# Patient Record
Sex: Male | Born: 1955 | Race: White | Hispanic: No | Marital: Married | Smoking: Never smoker
Health system: Southern US, Community
[De-identification: ages and names within clinical notes are randomized; demographics above are authoritative.]

## PROBLEM LIST (undated history)

## (undated) HISTORY — PX: NASAL SEPTUM SURGERY: SHX37

---

## 2006-12-17 ENCOUNTER — Ambulatory Visit: Payer: Self-pay | Admitting: Cardiology

## 2006-12-17 LAB — CONVERTED CEMR LAB
ALT: 17 units/L (ref 0–53)
Albumin: 4 g/dL (ref 3.5–5.2)
Alkaline Phosphatase: 58 units/L (ref 39–117)
BUN: 19 mg/dL (ref 6–23)
Basophils Absolute: 0 10*3/uL (ref 0.0–0.1)
Basophils Relative: 0.4 % (ref 0.0–1.0)
CO2: 29 meq/L (ref 19–32)
Calcium: 9.2 mg/dL (ref 8.4–10.5)
Eosinophils Absolute: 0.1 10*3/uL (ref 0.0–0.6)
GFR calc Af Amer: 115 mL/min
GFR calc non Af Amer: 95 mL/min
HDL: 81.6 mg/dL (ref >39.0)
Lymphocytes Relative: 26.8 % (ref 12.0–46.0)
MCHC: 33.9 g/dL (ref 30.0–36.0)
Monocytes Relative: 9.4 % (ref 3.0–11.0)
Neutro Abs: 2.4 10*3/uL (ref 1.4–7.7)
Platelets: 209 10*3/uL (ref 150–400)
TSH: 1.05 microintl units/mL (ref 0.35–5.50)
Triglycerides: 31 mg/dL (ref 0–149)
VLDL: 6 mg/dL (ref 0–40)

## 2006-12-20 ENCOUNTER — Ambulatory Visit: Payer: Self-pay | Admitting: Cardiology

## 2007-01-29 ENCOUNTER — Ambulatory Visit: Payer: Self-pay | Admitting: Internal Medicine

## 2007-02-25 ENCOUNTER — Ambulatory Visit (HOSPITAL_BASED_OUTPATIENT_CLINIC_OR_DEPARTMENT_OTHER): Admission: RE | Admit: 2007-02-25 | Discharge: 2007-02-25 | Payer: Self-pay | Admitting: Orthopedic Surgery

## 2007-02-25 ENCOUNTER — Encounter (INDEPENDENT_AMBULATORY_CARE_PROVIDER_SITE_OTHER): Payer: Self-pay | Admitting: Orthopedic Surgery

## 2010-10-10 NOTE — Op Note (Signed)
NAMEKELVEN, FLATER            ACCOUNT NO.:  [id number]   MEDICAL RECORD NO.:  [id number]          PATIENT TYPE:  AMB   LOCATION:  DSC                          FACILITY:  [facility]   PHYSICIAN:  Katy Fitch. Sypher, M.D. DATE OF BIRTH:  07-Dec-1955   DATE OF PROCEDURE:  02/25/2007  DATE OF DISCHARGE:                               OPERATIVE REPORT   PREOPERATIVE DIAGNOSES:  1. Enlarging mass, dorsal radial aspect of left wrist -- overlying      extensor retinaculum.  2. Soft tissue mass, subfascial dorsal aspect of left forearm.  3. Soft tissue mass, subfascial dorsal aspect of left forearm.   POSTOPERATIVE DIAGNOSES:  1. Epidermal inclusion cyst.  2. Angiolipoma, dorsal left forearm.  3. Angiolipoma, dorsal left forearm.   OPERATIONS:  1. Excision of epidermal cyst, dorsal radial aspect of the left wrist.  2. Through separate incision, resection of dorsal forearm angiolipoma      measuring approximately 9 mm x 7 mm.  3. Excision through separate incision of angiolipoma from ulnar volar      aspect of left forearm -- through separate incision.   OPEARTING SURGEON:  Katy Fitch. Sypher, M.D.   ASSISTANT:  Molly Maduro Dasnoit PA-C.   ANESTHESIA:  General by LMA.   SUPERVISING ANESTHESIOLOGIST:  Guadalupe Maple, M.D.   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  This was identified by his attending cardiologist, Dr.  Daleen Squibb.  Dr. Daleen Squibb referred him for excisional biopsy.  Clinical  examination suggested either retinacular ganglion or some type of soft  tissue inclusion cyst.  Mr. Kirkwood could not recall any penetrating  injury.   We made arrangements for excisional biopsy at this time.   In the interval between his office visit and the current examination in  the holding area, he also noted 2 soft tissue masses in the subfascial  region of his extensor carpi ulnaris muscle  in the mid forearm, along  the border of the ulna.  These were bothersome to him.  He requested  that these be excised for diagnosis.   After informed consent, Mr. Schloemer was brought to the operating room  at this time.   PROCEDURE:  Tayron Hunnell was brought to operating room and placed  supine position on the operating room table.   Following an anesthesia consult by Dr. Noreene Larsson in the holding area.  An  IV regional block with a proximal brachial tourniquet was recommended  and accepted by Mr. Dugger.   He was brought [facility] the [facility]; placed in the supine  position on the operating room table, and under Dr. Morley Kos supervision  an IV regional block placed.  After 12 minutes he had excellent  anesthesia.  The arm was prepped with Betadine soapy solution and  sterilely draped.   The procedure commenced with a short transverse incision directly over  the wrist mass.  I Identified a small sinus tract with loupe  magnification, therefore a portion of normal skin was excised with the  sinus tract.  Circumferential dissection beneath the skin and fascia  identified a marble-sized mass consistent with an epidermal cyst.  This  was dissected off the extensor retinaculum and off of several  subcutaneous veins.  This was kept intact with the skin bridge and  passed off for pathologic evaluation.  The wound was then meticulously  irrigated with sterile saline to prevent recurrence of the epidermal  cyst; followed by repair of the skin with intradermal 3-0 Prolene and  Steri-Strips.   Attention then directed to the dorsal forearm.  Two incisions were  fashioned over the palpable masses.  At each situation the subcutaneous  tissues were released, followed by release of the fascia.  An  angiolipoma was noted on the more dorsal position, measuring  approximately 9 x 6 mm, and then the second more volar location  approximately 12 x 8 mm.   These were circumferentially  dissected and electrocauterized along the  feeding vessels.  These were placed in formalin for pathologic  evaluation.  The wounds were then irrigated, followed by repair of the  forearm wound with subcutaneous 4-0 Vicryl with knots buried deep, and  intradermal 3-0 Prolene and Steri-Strips.   There no apparent complications.   Mr. Pedley tolerated the surgery and anesthesia well.  He was  transferred to the recovery room with stable vital signs.      Katy Fitch Sypher, M.D.  Electronically Signed     RVS/MEDQ  D:  02/25/2007  T:  02/25/2007  Job:  [id number]   cc:   Darroll C. Wall, MD, [facility]

## 2010-10-10 NOTE — Assessment & Plan Note (Signed)
[facility]                            CARDIOLOGY OFFICE NOTE   NAME:Michael Casey                   MRN:          [id number]  DATE:12/20/2006                            DOB:          September 13, 1955    Michael Casey is a 55 year old married white male friend of mine,  who comes today for a physical.  Other than some generalized fatigue, he  is feeling well.   He last saw [place] in 2002.  He recently [place] property  [place].  He goes up there almost all weekends.  He enjoys  working on the farm, doing manual labor.  He has no symptoms of angina  or ischemic equivalence.  He is also an active walker and golfs a lot.   His risk factors are only age and sex.  He has remarkably good lipids.  He does not smoke.  He does not have hypertension.  He does not have a  premature family history.  He is not diabetic.   Prior to our visit, we obtained blood work.  He has a benign neutropenia  with a white count of 4000, which is not changed in the last few years.  His differential is normal.  His platelet count is normal.  His  hemoglobin is 14.6.  His CHEM-7 was normal, except for slightly  evaluated blood sugar of 101, which is really below 110, which is fine  with current guidelines.  His LFTs were normal.  His total cholesterol  was 185, triglycerides 31, HDL 81.6, LDL 97.  His TSH was normal.  PSA  was normal at 1.38.   PAST MEDICAL HISTORY:  He is currently on really no medications.  He has  no dye allergies.  He has no medical allergies.   He does drink 4 cups of caffeinated beverage a day.  He smoked but he  quit in 1979.   SURGICAL HISTORY:  He had a deviated septum repair in 1986.   FAMILY HISTORY:  Negative.   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   He is married and has 3 children.   REVIEW OF SYSTEMS:  Other than a small area on his left wrist, which he  has noticed  over the last several months, is negative.   He denies any melena, hematochezia, weight loss, appetite change.   PHYSICAL EXAMINATION:  His blood pressure is 122/78, pulse 58 and  regular, weight 204.  His EKG is normal.  HEENT:  Normocephalic, atraumatic.  PERRLA.  Extraocular muscles are  intact.  Sclerae clear.  Facial symmetry is normal.  Carotid upstrokes  are equal bilaterally without bruits.  There is no JVD.  Thyroid is not  enlarged.  His funduscopic exam is benign.  No hemorrhages.  No exudate.  No AV nicking or humping.  LUNGS:  Clear to auscultation and percussion.  HEART:  Nondisplaced PMI.  There is normal S1, S2 without murmur, rub,  or gallop.  ABDOMEN:  Soft with good bowel sounds.  No midline bruit.  No  hepatomegaly.  EXTREMITIES:  No cyanosis, clubbing, or edema.  Pulses are intact.  NEURO:  Intact.  SKIN:  Unremarkable.  He does have a small ganglionic cyst on the left  medial wrist.   ASSESSMENT AND PLAN:  Michael Casey is in excellent physical shape.  I think  he has a ganglionic cyst of his left wrist.  I have asked him to contact  Michael Casey, a friend of ours, to see if this needs to be removed.  It eventually probably will need to be removed.   He is due a screening colonoscopy, considering that he is over the age  of 15.  I am going to arrange a visit with Dr. Yancey Casey, whom he knows  as well.  I will plan on seeing him back in a year.   We talked about any indication for a stress test.  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  We  talked about symptoms to worry about and how to respond, and obviously  to notify us.   I will plan on seeing him back in a year.     Michael C. Daleen Squibb, MD, [facility]  Electronically Signed    TCW/MedQ  DD: 12/20/2006  DT: 12/21/2006  Job #: [id number]   cc:   Wilhemina Bonito. Marina Goodell, MD  Katy Fitch Sypher, M.D.

## 2011-03-08 LAB — POCT HEMOGLOBIN-HEMACUE
Hemoglobin: 13.4
Operator id: 112821

## 2012-10-30 ENCOUNTER — Other Ambulatory Visit: Payer: Self-pay | Admitting: Dermatology

## 2012-12-17 ENCOUNTER — Other Ambulatory Visit: Payer: Self-pay | Admitting: Dermatology

## 2013-02-13 ENCOUNTER — Ambulatory Visit (INDEPENDENT_AMBULATORY_CARE_PROVIDER_SITE_OTHER): Payer: PRIVATE HEALTH INSURANCE | Admitting: Internal Medicine

## 2013-02-13 DIAGNOSIS — Z23 Encounter for immunization: Secondary | ICD-10-CM

## 2013-02-13 DIAGNOSIS — Z789 Other specified health status: Secondary | ICD-10-CM

## 2013-02-13 MED ORDER — CIPROFLOXACIN HCL 500 MG PO TABS: 500.0000 mg | ORAL_TABLET | Freq: Two times a day (BID) | ORAL | Status: DC

## 2013-02-13 MED ORDER — ATOVAQUONE-PROGUANIL HCL 250-100 MG PO TABS: 1.0000 | ORAL_TABLET | Freq: Every day | ORAL | Status: DC

## 2013-02-14 NOTE — Progress Notes (Signed)
[facility] TRAVEL CLINIC NOTE  RFV: upcoming travel [place] [facility] [place] Subjective:    Patient ID: Michael Casey, male    DOB: 05-10-1956, 57 y.o.   MRN: [id number]  HPI Michael Casey is a 57 yo M in good state of health who will be traveling with his wife to [place] for a period of 2 wks. He has had some vaccines in the past inc hep A/B, and typhoid for previous international travel. He also donates consistently blood and platelets and asks what traveling to [place] will do to his donor status.   Imms: hep A, B, tdap, typhoid in 2009 All: NKMA     Review of Systems     Objective:   Physical Exam        Assessment & Plan:  Pre-travel vaccination = recommended to get yellow fever since their itinerary goes back to [place] after going to [place]. Also received typhoid inj. Recommended to get flu prior to leaving  Malaria prophylaxis = gave rx for malarone and instructions to use  Traveler's diarrhea= gave rx for cipro  Also recommended to get registered on [place] state dept and have travel insurance.

## 2017-03-19 DIAGNOSIS — Z23 Encounter for immunization: Secondary | ICD-10-CM | POA: Diagnosis not present

## 2017-04-09 ENCOUNTER — Encounter: Payer: Self-pay | Admitting: Sports Medicine

## 2017-04-09 ENCOUNTER — Ambulatory Visit (INDEPENDENT_AMBULATORY_CARE_PROVIDER_SITE_OTHER): Payer: 59 | Admitting: Sports Medicine

## 2017-04-09 DIAGNOSIS — M7711 Lateral epicondylitis, right elbow: Secondary | ICD-10-CM | POA: Diagnosis not present

## 2017-04-09 DIAGNOSIS — M7710 Lateral epicondylitis, unspecified elbow: Secondary | ICD-10-CM | POA: Insufficient documentation

## 2017-04-09 NOTE — Progress Notes (Signed)
   [facility] Phone: [phone]  Subjective:  Michael Casey is a 61 year old male presenting to clinic with right elbow pain for the last 2-3 months. The pain is located in his lateral elbow. He describes the pain as "able to get his attention". He normally lifts weights 2-3 times per week. He took 2-3 weeks off of lifting and then returned to lifting, where he did bicep curls, pushups, and lat pulldowns. He noticed a "twinge" in his lateral elbow while he was doing this. The next day, he did a lot of yard work. The elbow pain started while he was doing yard work. He also played golf a few days later and this really irritated his elbow too. The pain is worse with grabbing an object and lifting up. The pain is better with using a compression sleeve, icing, and Biofreeze. He has been resting his elbow over the last 2 weeks and has not been doing any weight training. The elbow has improved with rest. He denies any swelling of the elbow or decreased range of motion. He denies any radiating pain or numbness. He endorses some occasional mild tingling in the lower lateral aspect of his left hand and fifth finger.  ROS: See HPI for pertinent positives and negatives  Past medical history reviewed Medications reviewed Allergies reviewed  Objective: BP 116/82   Ht 5\' 11"  (1.803 m)   Wt 193 lb (87.5 kg)   BMI 26.92 kg/m  Gen: NAD, alert, cooperative with exam Right Elbow: No erythema, edema, or gross deformity. Full range of motion of the elbow. Tenderness to palpation where the extensor tendons attach to the lateral epicondyle. Tenderness to palpation where the extensor carpi radialis longus tendon attaches to the lateral supracondylar ridge of the humerus. Pain with resisted extension at the wrist.  Neuro: Distal right upper extremity is neurovascularly intact.   US Exam of Right Elbow: Traction spur present off the lateral epicondyle. Calcifications and signs of partial tearing present  within the extensor tendons near the region where it attaches to the lateral epicondyle.   Assessment/Plan: Right Lateral Epicondylitis: Patient with pain over the area where the extensor tendons attach to the lateral epicondyle after doing yard work. Ultrasound exam showed a traction spur off the lateral epicondyle with some chronic changes to the extensor tendons including calcifications and some signs of partial tearing.  - Patient given handout for home rehabilitation exercises - Continue compression sleeve, icing, NSAIDs prn - Rest with avoidance of exercises that cause pain for the next 3 weeks. Can start slowly re-introducing exercises after that. - Follow-up with Dr. Micheline Chapman in 6 weeks - If no improvement, could consider formal PT at that time   Hyman Bible, MD PGY-3  Patient seen and evaluated with the resident. I agree with the above plan of care. Patient's symptoms are already improving with 3 weeks of relative rest. I recommended an additional 3 weeks of relative rest before increasing his activity in the gym. Follow-up with me in 6 weeks for reevaluation. If symptoms persist we could consider merits of topical nitroglycerin or physical therapy. Patient is encouraged to call me with questions or concerns prior to his follow-up visit.

## 2017-04-09 NOTE — Assessment & Plan Note (Signed)
Patient with pain over the area where the extensor tendons attach to the lateral epicondyle after doing yard work. Ultrasound exam showed a traction spur off the lateral epicondyles with some chronic changes to the extensor tendons including calcifications and some signs of partial tearing.  - Patient given handout for home rehabilitation exercises - Continue compression sleeve, icing, NSAIDs prn - Rest with avoidance of exercises that cause pain for the next 3 weeks. Can start slowly re-introducing exercises after that. - Follow-up with Dr. Micheline Chapman in 6 weeks - If no improvement, could consider formal PT at that time

## 2017-04-10 ENCOUNTER — Encounter: Payer: Self-pay | Admitting: Sports Medicine

## 2017-04-17 DIAGNOSIS — Z125 Encounter for screening for malignant neoplasm of prostate: Secondary | ICD-10-CM | POA: Diagnosis not present

## 2017-04-17 DIAGNOSIS — Z0000 Encounter for general adult medical examination without abnormal findings: Secondary | ICD-10-CM | POA: Diagnosis not present

## 2017-04-25 DIAGNOSIS — K210 Gastro-esophageal reflux disease with esophagitis: Secondary | ICD-10-CM | POA: Diagnosis not present

## 2017-04-25 DIAGNOSIS — R002 Palpitations: Secondary | ICD-10-CM | POA: Diagnosis not present

## 2017-04-25 DIAGNOSIS — Z0000 Encounter for general adult medical examination without abnormal findings: Secondary | ICD-10-CM | POA: Diagnosis not present

## 2017-05-16 ENCOUNTER — Encounter: Payer: Self-pay | Admitting: Sports Medicine

## 2017-05-16 ENCOUNTER — Ambulatory Visit (INDEPENDENT_AMBULATORY_CARE_PROVIDER_SITE_OTHER): Payer: 59 | Admitting: Sports Medicine

## 2017-05-16 VITALS — BP 133/87 | Ht 71.0 in | Wt 193.0 lb

## 2017-05-16 DIAGNOSIS — M7711 Lateral epicondylitis, right elbow: Secondary | ICD-10-CM

## 2017-05-17 NOTE — Progress Notes (Signed)
   Subjective:    Patient ID: Michael Casey, male    DOB: 07/03/1955, 61 y.o.   MRN: [id number]  HPI   Patient comes in today for follow-up on right elbow lateral epicondylitis. He is doing much better. Minimal pain. He has been compliant with his home exercises. He has returned to weight lifting at the gym but is avoiding those exercises that cause pain. He is only needing an occasional Aleve. He is very happy with his progress to date.   Review of Systems    as above Objective:   Physical Exam  Well-developed, well-nourished. No acute distress. Awake alert and oriented 3. Vital signs reviewed  Right elbow: Full range of motion. No effusion. No soft tissue swelling. He is still tender to palpation over the lateral epicondyle and has slight pain with ECRB testing but not as severe as it was previously. No tenderness to palpation over the medial epicondyle. Good grip strength. Good pulses.      Assessment & Plan:   Improving right elbow pain secondary to lateral epicondylitis  Patient is doing well. I've encouraged him to continue with his home exercise program for an additional 3-6 weeks. He also needs to continue to modify his weight lifting to avoid those exercises that cause him pain. If symptoms once again worsen then we could consider topical nitroglycerin or formal physical therapy. Follow-up for ongoing or recalcitrant issues.

## 2018-02-03 ENCOUNTER — Emergency Department (HOSPITAL_COMMUNITY): Payer: 59

## 2018-02-03 ENCOUNTER — Encounter (HOSPITAL_COMMUNITY): Payer: Self-pay | Admitting: Emergency Medicine

## 2018-02-03 ENCOUNTER — Observation Stay (HOSPITAL_COMMUNITY)
Admission: EM | Admit: 2018-02-03 | Discharge: 2018-02-04 | Disposition: A | Discharge status: Home / Self Care | Payer: 59 | Attending: Surgery | Admitting: Surgery

## 2018-02-03 DIAGNOSIS — K3589 Other acute appendicitis without perforation or gangrene: Principal | ICD-10-CM | POA: Insufficient documentation

## 2018-02-03 DIAGNOSIS — K37 Unspecified appendicitis: Secondary | ICD-10-CM | POA: Diagnosis present

## 2018-02-03 DIAGNOSIS — K3580 Unspecified acute appendicitis: Secondary | ICD-10-CM | POA: Diagnosis not present

## 2018-02-03 LAB — I-STAT CHEM 8, ED
BUN: 21 mg/dL (ref 8–23)
CREATININE: 1 mg/dL (ref 0.61–1.24)
Calcium, Ion: 1.2 mmol/L (ref 1.15–1.40)
Chloride: 105 mmol/L (ref 98–111)
GLUCOSE: 134 mg/dL — AB (ref 70–99)
HCT: 44 % (ref 39.0–52.0)
Hemoglobin: 15 g/dL (ref 13.0–17.0)
Potassium: 3.7 mmol/L (ref 3.5–5.1)
Sodium: 139 mmol/L (ref 135–145)
TCO2: 24 mmol/L (ref 22–32)

## 2018-02-03 LAB — CBC
HEMATOCRIT: 44.9 % (ref 39.0–52.0)
Hemoglobin: 15.3 g/dL (ref 13.0–17.0)
MCH: 30.2 pg (ref 26.0–34.0)
MCHC: 34.1 g/dL (ref 30.0–36.0)
MCV: 88.7 fL (ref 78.0–100.0)
Platelets: 224 10*3/uL (ref 150–400)
RBC: 5.06 MIL/uL (ref 4.22–5.81)
RDW: 12.4 % (ref 11.5–15.5)
WBC: 9.5 10*3/uL (ref 4.0–10.5)

## 2018-02-03 LAB — URINALYSIS, ROUTINE W REFLEX MICROSCOPIC
BILIRUBIN URINE: NEGATIVE
GLUCOSE, UA: NEGATIVE mg/dL
Hgb urine dipstick: NEGATIVE
Ketones, ur: 20 mg/dL — AB
Leukocytes, UA: NEGATIVE
Nitrite: NEGATIVE
PH: 5 (ref 5.0–8.0)
Protein, ur: NEGATIVE mg/dL
Specific Gravity, Urine: >1.046 — ABNORMAL HIGH (ref 1.005–1.030)

## 2018-02-03 LAB — COMPREHENSIVE METABOLIC PANEL
ALBUMIN: 4.3 g/dL (ref 3.5–5.0)
ALT: 15 U/L (ref 0–44)
AST: 25 U/L (ref 15–41)
Alkaline Phosphatase: 71 U/L (ref 38–126)
Anion gap: 11 (ref 5–15)
BUN: 18 mg/dL (ref 8–23)
CHLORIDE: 103 mmol/L (ref 98–111)
CO2: 24 mmol/L (ref 22–32)
Calcium: 9.5 mg/dL (ref 8.9–10.3)
Creatinine, Ser: 1 mg/dL (ref 0.61–1.24)
GFR calc Af Amer: >60 mL/min (ref >60)
GFR calc non Af Amer: >60 mL/min (ref >60)
GLUCOSE: 139 mg/dL — AB (ref 70–99)
Potassium: 3.7 mmol/L (ref 3.5–5.1)
Sodium: 138 mmol/L (ref 135–145)
Total Bilirubin: 1.2 mg/dL (ref 0.3–1.2)
Total Protein: 7.2 g/dL (ref 6.5–8.1)

## 2018-02-03 LAB — LIPASE, BLOOD: Lipase: 32 U/L (ref 11–51)

## 2018-02-03 MED ORDER — METRONIDAZOLE IN NACL 5-0.79 MG/ML-% IV SOLN
500.0000 mg | Freq: Once | INTRAVENOUS | Status: AC
  Administered 2018-02-03: 500 mg via INTRAVENOUS
  Filled 2018-02-03: qty 100

## 2018-02-03 MED ORDER — METHOCARBAMOL 1000 MG/10ML IJ SOLN: 500.0000 mg | Freq: Three times a day (TID) | INTRAVENOUS | Status: DC | PRN

## 2018-02-03 MED ORDER — METRONIDAZOLE IN NACL 5-0.79 MG/ML-% IV SOLN
500.0000 mg | Freq: Three times a day (TID) | INTRAVENOUS | Status: DC
  Administered 2018-02-04 (×2): 500 mg via INTRAVENOUS
  Filled 2018-02-03 (×3): qty 100

## 2018-02-03 MED ORDER — ONDANSETRON HCL 4 MG/2ML IJ SOLN
4.0000 mg | Freq: Four times a day (QID) | INTRAMUSCULAR | Status: DC | PRN
  Administered 2018-02-04: 4 mg via INTRAVENOUS
  Filled 2018-02-03: qty 2

## 2018-02-03 MED ORDER — SODIUM CHLORIDE 0.9 % IV SOLN
INTRAVENOUS | Status: DC
  Administered 2018-02-04: via INTRAVENOUS

## 2018-02-03 MED ORDER — ACETAMINOPHEN 650 MG RE SUPP: 650.0000 mg | Freq: Four times a day (QID) | RECTAL | Status: DC | PRN

## 2018-02-03 MED ORDER — KETOROLAC TROMETHAMINE 30 MG/ML IJ SOLN
30.0000 mg | Freq: Once | INTRAMUSCULAR | Status: AC
  Administered 2018-02-03: 30 mg via INTRAVENOUS
  Filled 2018-02-03: qty 1

## 2018-02-03 MED ORDER — ONDANSETRON 4 MG PO TBDP
8.0000 mg | ORAL_TABLET | Freq: Once | ORAL | Status: AC
  Administered 2018-02-03: 8 mg via ORAL
  Filled 2018-02-03: qty 2

## 2018-02-03 MED ORDER — HYDROMORPHONE HCL 1 MG/ML IJ SOLN
1.0000 mg | INTRAMUSCULAR | Status: DC | PRN
  Administered 2018-02-03: 1 mg via INTRAVENOUS
  Filled 2018-02-03: qty 1

## 2018-02-03 MED ORDER — SODIUM CHLORIDE 0.9 % IV SOLN
2.0000 g | Freq: Once | INTRAVENOUS | Status: AC
  Administered 2018-02-03: 2 g via INTRAVENOUS
  Filled 2018-02-03: qty 20

## 2018-02-03 MED ORDER — HYDROMORPHONE HCL 1 MG/ML IJ SOLN
1.0000 mg | INTRAMUSCULAR | Status: DC | PRN
  Administered 2018-02-04 (×2): 1 mg via INTRAVENOUS
  Filled 2018-02-03 (×2): qty 1

## 2018-02-03 MED ORDER — ENOXAPARIN SODIUM 40 MG/0.4ML ~~LOC~~ SOLN: 40.0000 mg | SUBCUTANEOUS | Status: DC

## 2018-02-03 MED ORDER — IOPAMIDOL (ISOVUE-300) INJECTION 61%
INTRAVENOUS | Status: AC
  Filled 2018-02-03: qty 100

## 2018-02-03 MED ORDER — OXYCODONE-ACETAMINOPHEN 5-325 MG PO TABS
2.0000 | ORAL_TABLET | Freq: Once | ORAL | Status: AC | PRN
  Administered 2018-02-03: 2 via ORAL
  Filled 2018-02-03: qty 2

## 2018-02-03 MED ORDER — SODIUM CHLORIDE 0.9 % IV SOLN
2.0000 g | INTRAVENOUS | Status: DC
  Filled 2018-02-03: qty 20

## 2018-02-03 MED ORDER — SIMETHICONE 80 MG PO CHEW
40.0000 mg | CHEWABLE_TABLET | Freq: Four times a day (QID) | ORAL | Status: DC | PRN
  Filled 2018-02-03: qty 1

## 2018-02-03 MED ORDER — IOPAMIDOL (ISOVUE-300) INJECTION 61%
100.0000 mL | Freq: Once | INTRAVENOUS | Status: AC | PRN
  Administered 2018-02-03: 100 mL via INTRAVENOUS

## 2018-02-03 MED ORDER — ONDANSETRON 4 MG PO TBDP: 4.0000 mg | ORAL_TABLET | Freq: Four times a day (QID) | ORAL | Status: DC | PRN

## 2018-02-03 MED ORDER — ACETAMINOPHEN 325 MG PO TABS: 650.0000 mg | ORAL_TABLET | Freq: Four times a day (QID) | ORAL | Status: DC | PRN

## 2018-02-03 MED ORDER — ONDANSETRON HCL 4 MG/2ML IJ SOLN
4.0000 mg | Freq: Three times a day (TID) | INTRAMUSCULAR | Status: DC | PRN
  Administered 2018-02-03: 4 mg via INTRAVENOUS
  Filled 2018-02-03: qty 2

## 2018-02-03 NOTE — ED Notes (Signed)
Pt called out twice for pain medication, Dr Donne Hazel made aware and will come see the pt.

## 2018-02-03 NOTE — ED Triage Notes (Signed)
Pt presents with RLQ abd pain that began this morning at 4am; pt also having bloating, gas, and nausea; last BM yesterday; pt concerned for appendix

## 2018-02-03 NOTE — H&P (Signed)
Michael Casey is an 62 y.o. male.   Chief Complaint: abd pain HPI: 20 yom on his birthday today developed about 0430 abdominal pain worse throughout the day.  Some nausea, no emesis.   Had csc about 10 years ago.  Nothing making the pain better, just worse especially with movement. He is able to get up and go to restroom now.  No fevers.  He came to er and underwent ct scan that shows appendicitis   History reviewed. No pertinent past medical history.  Past Surgical History:  Procedure Laterality Date  . NASAL SEPTUM SURGERY      History reviewed. No pertinent family history. Social History:  reports that he has never smoked. He has never used smokeless tobacco. He reports that he has current or past drug history. His alcohol history is not on file.  Allergies: No Known Allergies  meds none  Results for orders placed or performed during the hospital encounter of 02/03/18 (from the past 48 hour(s))  Lipase, blood     Status: None   Collection Time: 02/03/18  6:54 PM  Result Value Ref Range   Lipase 32 11 - 51 U/L    Comment: Performed at [facility], [place] [address], [place], [place] [address]  Comprehensive metabolic panel     Status: Abnormal   Collection Time: 02/03/18  6:54 PM  Result Value Ref Range   Sodium 138 135 - 145 mmol/L   Potassium 3.7 3.5 - 5.1 mmol/L   Chloride 103 98 - 111 mmol/L   CO2 24 22 - 32 mmol/L   Glucose, Bld 139 (H) 70 - 99 mg/dL   BUN 18 8 - 23 mg/dL   Creatinine, Ser 1.00 0.61 - 1.24 mg/dL   Calcium 9.5 8.9 - 10.3 mg/dL   Total Protein 7.2 6.5 - 8.1 g/dL   Albumin 4.3 3.5 - 5.0 g/dL   AST 25 15 - 41 U/L   ALT 15 0 - 44 U/L   Alkaline Phosphatase 71 38 - 126 U/L   Total Bilirubin 1.2 0.3 - 1.2 mg/dL   GFR calc non Af Amer >60 >60 mL/min   GFR calc Af Amer >60 >60 mL/min    Comment: (NOTE) The eGFR has been calculated using the CKD EPI equation. This calculation has not been validated in all clinical situations. eGFR's persistently  <60 mL/min signify possible Chronic Kidney Disease.    Anion gap 11 5 - 15    Comment: Performed at [facility] [address], [place], [place] [address]  CBC     Status: None   Collection Time: 02/03/18  6:54 PM  Result Value Ref Range   WBC 9.5 4.0 - 10.5 K/uL   RBC 5.06 4.22 - 5.81 MIL/uL   Hemoglobin 15.3 13.0 - 17.0 g/dL   HCT 44.9 39.0 - 52.0 %   MCV 88.7 78.0 - 100.0 fL   MCH 30.2 26.0 - 34.0 pg   MCHC 34.1 30.0 - 36.0 g/dL   RDW 12.4 11.5 - 15.5 %   Platelets 224 150 - 400 K/uL    Comment: Performed at [facility] [address], [place], [place] [address]  I-Stat Chem 8, ED     Status: Abnormal   Collection Time: 02/03/18  7:20 PM  Result Value Ref Range   Sodium 139 135 - 145 mmol/L   Potassium 3.7 3.5 - 5.1 mmol/L   Chloride 105 98 - 111 mmol/L   BUN  21 8 - 23 mg/dL   Creatinine, Ser 1.00 0.61 - 1.24 mg/dL   Glucose, Bld 134 (H) 70 - 99 mg/dL   Calcium, Ion 1.20 1.15 - 1.40 mmol/L   TCO2 24 22 - 32 mmol/L   Hemoglobin 15.0 13.0 - 17.0 g/dL   HCT 44.0 39.0 - 52.0 %  Urinalysis, Routine w reflex microscopic     Status: Abnormal   Collection Time: 02/03/18  9:24 PM  Result Value Ref Range   Color, Urine YELLOW YELLOW   APPearance CLEAR CLEAR   Specific Gravity, Urine >1.046 (H) 1.005 - 1.030   pH 5.0 5.0 - 8.0   Glucose, UA NEGATIVE NEGATIVE mg/dL   Hgb urine dipstick NEGATIVE NEGATIVE   Bilirubin Urine NEGATIVE NEGATIVE   Ketones, ur 20 (A) NEGATIVE mg/dL   Protein, ur NEGATIVE NEGATIVE mg/dL   Nitrite NEGATIVE NEGATIVE   Leukocytes, UA NEGATIVE NEGATIVE    Comment: Performed at [facility], [place] [address], [place], [place] [address]   Ct Abdomen Pelvis W Contrast  Addendum Date: 02/03/2018   ADDENDUM REPORT: 02/03/2018 21:24 ADDENDUM: Omitted from initial dictation is presence of an 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 Electronically Signed   By: Lavonia Dana M.D.   On: 02/03/2018 21:24    Result Date: 02/03/2018 CLINICAL DATA:  Acute generalized abdominal pain, RIGHT lower quadrant pain since 0430 hours, nausea, vomiting, fever, chills EXAM: CT ABDOMEN AND PELVIS WITH CONTRAST TECHNIQUE: Multidetector CT imaging of the abdomen and pelvis was performed using the standard protocol following bolus administration of intravenous contrast. CONTRAST:  184m ISOVUE-300 IOPAMIDOL (ISOVUE-300) INJECTION 61% IV. No oral contrast. COMPARISON:  None FINDINGS: Lower chest: Lung bases clear Hepatobiliary: Gallbladder and liver normal appearance Pancreas: Normal appearance Spleen: Normal appearance Adrenals/Urinary Tract: Tiny cysts at inferior pole RIGHT kidney. Adrenal glands, kidneys, ureters, and bladder otherwise normal appearance. Stomach/Bowel: Dilated appendix with periappendiceal inflammatory changes compatible with acute appendicitis: Appendix: Location: Inferior to cecal tip extending into RIGHT pelvis Diameter: 16 mm Appendicolith: Present, 6 mm Mucosal hyper-enhancement: Present, mild Extraluminal gas: None Periappendiceal collection: No defined periappendiceal collection. Small amount of dependent fluid at the caudal extent of the RIGHT pericolic gutter. Small amount of free fluid in pelvis. Vascular/Lymphatic: Vascular structures patent.  No adenopathy. Reproductive: Minimal prostatic enlargement Other: No free air.  No hernia. Musculoskeletal: Unremarkable IMPRESSION: Acute appendicitis with small amount of free fluid in the pelvis and at the caudal extent of the RIGHT pericolic gutter. Appendicular LEFT present. No evidence of defined abscess or extraluminal gas. Findings called to AMargarita MailPA on 02/03/2018 at 2058 hours. Electronically Signed: By: MLavonia DanaM.D. On: 02/03/2018 20:58    Review of Systems  Gastrointestinal: Positive for abdominal pain and nausea.  All other systems reviewed and are negative.   Blood pressure (!) 161/87, pulse 63, temperature 97.9 F (36.6 C),  temperature source Oral, resp. rate 16, height '5\' 11"'$  (1.803 m), weight 88 kg, SpO2 99 %. Physical Exam  Vitals reviewed. Constitutional: He is oriented to person, place, and time. He appears well-developed.  HENT:  Head: Normocephalic and atraumatic.  Right Ear: External ear normal.  Left Ear: External ear normal.  Mouth/Throat: Oropharynx is clear and moist.  Eyes: Pupils are equal, round, and reactive to light. No scleral icterus.  Neck: Neck supple.  Cardiovascular: Normal rate and regular rhythm.  Respiratory: Effort normal. No respiratory distress.  GI: Soft. Bowel sounds are normal.  There is tenderness in the right lower quadrant. There is positive Murphy's sign. No hernia.  Neurological: He is alert and oriented to person, place, and time.  Skin: Skin is warm and dry.  Psychiatric: His behavior is normal. Thought content normal.     Assessment/Plan Appendicitis  He appears by time course and ct to have uncomplicated appendicitis.  He has received antibiotics in ER already.  OR is not able to do this for some time at this point so I discussed with them admission, iv abx and plan for surgery in am. We discussed nonoperative mgt but I recommended surgery.  He and his wife understand plan  Rolm Bookbinder, MD 02/03/2018, 10:43 PM

## 2018-02-03 NOTE — ED Provider Notes (Signed)
MSE was initiated and I personally evaluated the patient and placed orders (if any) at  6:44 PM on February 03, 2018.  The patient appears stable so that the remainder of the MSE may be completed by another provider.   Patient placed in Quick Look pathway, seen and evaluated   Chief Complaint: RLQ abdominal pain  HPI:   Onset RLQ abdominal pain this morning. Progressively worsening. + bloating and nausea. No flank pain, urinary sxs, vomiting, constipation, diarrhea. No meds, Shx or contributing PMH.  ROS: Abdominal pain, nausea (one)  Physical Exam:   Gen: No distress  Neuro: Awake and Alert  Skin: Warm    Focused Exam: focal RLQ tenderness with rebound   Initiation of care has begun. The patient has been counseled on the process, plan, and necessity for staying for the completion/evaluation, and the remainder of the medical screening examination    Margarita Mail, PA-C 02/03/18 [place], [place], DO 02/03/18 2314

## 2018-02-03 NOTE — ED Provider Notes (Signed)
[facility] EMERGENCY DEPARTMENT Provider Note   CSN: [id number] Arrival date & time: 02/03/18  1727     History   Chief Complaint Chief Complaint  Patient presents with  . Abdominal Pain  . Nausea  . Bloated    HPI Michael Casey is a 62 y.o. male presents emergency department chief complaint of right lower quadrant pain.  Patient had of localized abdominal pain started has been progressively worsening.  A sensation of bloating and pain.  No bowel movements today but denies patient, diarrhea.  He has associated nausea and queasiness without vomiting, fever or chills.  HPI  History reviewed. No pertinent past medical history.  Patient Active Problem List   Diagnosis Date Noted  . Lateral epicondylitis 04/09/2017    Past Surgical History:  Procedure Laterality Date  . NASAL SEPTUM SURGERY          Home Medications    Prior to Admission medications   Medication Sig Start Date End Date Taking? Authorizing Provider  atovaquone-proguanil (MALARONE) 250-100 MG TABS Take 1 tablet by mouth daily. Take 2 days prior to leaving on trip, take daily until complete Patient not taking: Reported on 04/09/2017 02/13/13   Carlyle Basques, MD  ciprofloxacin (CIPRO) 500 MG tablet Take 1 tablet (500 mg total) by mouth 2 (two) times daily. If needed for diarrhea, can stop if diarrhea resolves Patient not taking: Reported on 04/09/2017 02/13/13   Carlyle Basques, MD    Family History History reviewed. No pertinent family history.  Social History Social History   Tobacco Use  . Smoking status: Never Smoker  . Smokeless tobacco: Never Used  Substance Use Topics  . Alcohol use: Not on file    Comment: social, glass wine daily  . Drug use: Not Currently     Allergies   Patient has no known allergies.   Review of Systems Review of Systems  Ten systems reviewed and are negative for acute change, except as noted in the HPI.   Physical Exam Updated Vital  Signs BP (!) 145/87 (BP Location: Right Arm)   Pulse 72   Temp 97.9 F (36.6 C) (Oral)   Resp 16   Ht 5\' 11"  (1.803 m)   Wt 88 kg   SpO2 99%   BMI 27.06 kg/m   Physical Exam  Constitutional: He appears well-developed and well-nourished. He appears ill. No distress.  HENT:  Head: Normocephalic and atraumatic.  Eyes: Conjunctivae are normal. No scleral icterus.  Neck: Normal range of motion. Neck supple.  Cardiovascular: Normal rate, regular rhythm and normal heart sounds.  Pulmonary/Chest: Effort normal and breath sounds normal. No respiratory distress.  Abdominal: Soft. There is tenderness in the right lower quadrant. There is rebound and guarding.  Musculoskeletal: He exhibits no edema.  Neurological: He is alert.  Skin: Skin is warm and dry. He is not diaphoretic.  Psychiatric: His behavior is normal.  Nursing note and vitals reviewed.    ED Treatments / Results  Labs (all labs ordered are listed, but only abnormal results are displayed) Labs Reviewed  COMPREHENSIVE METABOLIC PANEL - Abnormal; Notable for the following components:      Result Value   Glucose, Bld 139 (*)    All other components within normal limits  I-STAT CHEM 8, ED - Abnormal; Notable for the following components:   Glucose, Bld 134 (*)    All other components within normal limits  LIPASE, BLOOD  CBC  URINALYSIS, ROUTINE W REFLEX MICROSCOPIC  EKG None  Radiology No results found.  Procedures Procedures (including critical care time)  Medications Ordered in ED Medications  oxyCODONE-acetaminophen (PERCOCET/ROXICET) 5-325 MG per tablet 2 tablet (has no administration in time range)  iopamidol (ISOVUE-300) 61 % injection (has no administration in time range)  cefTRIAXone (ROCEPHIN) 2 g in sodium chloride 0.9 % 100 mL IVPB (has no administration in time range)    And  metroNIDAZOLE (FLAGYL) IVPB 500 mg (has no administration in time range)  ondansetron (ZOFRAN-ODT) disintegrating tablet  8 mg (8 mg Oral Given 02/03/18 1853)  iopamidol (ISOVUE-300) 61 % injection 100 mL (100 mLs Intravenous Contrast Given 02/03/18 2024)     Initial Impression / Assessment and Plan / ED Course  I have reviewed the triage vital signs and the nursing notes.  Pertinent labs & imaging results that were available during my care of the patient were reviewed by me and considered in my medical decision making (see chart for details).  Clinical Course as of Feb 04 2104  Mon Feb 03, 2018  2102 Call from Dr. Thornton Papas radiology.  Patient has positive appendicitis.  I have ordered ceftriaxone and Flagyl IV.  I placed a call to surgery for.   [AH]    Clinical Course User Index [AH] Margarita Mail, PA-C    Acute appendicitis.  Receiving IV ceftriaxone and metronidazole.  I spoke with Dr. Donne Hazel who will see the patient for admission.  He is n.p.o.  Patient declines pain medication at this time.  Final Clinical Impressions(s) / ED Diagnoses   Final diagnoses:  Other acute appendicitis    ED Discharge Orders    None       Margarita Mail, PA-C 02/03/18 2128    Deno Etienne, DO 02/03/18 2314

## 2018-02-04 ENCOUNTER — Inpatient Hospital Stay (HOSPITAL_COMMUNITY): Payer: 59 | Admitting: Anesthesiology

## 2018-02-04 ENCOUNTER — Encounter (HOSPITAL_COMMUNITY): Payer: Self-pay | Admitting: Anesthesiology

## 2018-02-04 ENCOUNTER — Encounter (HOSPITAL_COMMUNITY)
Admission: EM | Disposition: A | Discharge status: Home / Self Care | Payer: Self-pay | Source: Home / Self Care | Attending: Emergency Medicine

## 2018-02-04 DIAGNOSIS — K37 Unspecified appendicitis: Secondary | ICD-10-CM | POA: Diagnosis not present

## 2018-02-04 DIAGNOSIS — K3589 Other acute appendicitis without perforation or gangrene: Secondary | ICD-10-CM | POA: Diagnosis not present

## 2018-02-04 DIAGNOSIS — K3580 Unspecified acute appendicitis: Secondary | ICD-10-CM | POA: Diagnosis not present

## 2018-02-04 HISTORY — PX: LAPAROSCOPIC APPENDECTOMY: SHX408

## 2018-02-04 LAB — MRSA PCR SCREENING: MRSA by PCR: NEGATIVE

## 2018-02-04 SURGERY — APPENDECTOMY, LAPAROSCOPIC
Anesthesia: General | Site: Abdomen

## 2018-02-04 MED ORDER — OXYCODONE HCL 5 MG PO TABS: 5.0000 mg | ORAL_TABLET | Freq: Four times a day (QID) | ORAL | 0 refills | Status: AC | PRN

## 2018-02-04 MED ORDER — BUPIVACAINE-EPINEPHRINE (PF) 0.25% -1:200000 IJ SOLN
INTRAMUSCULAR | Status: AC
  Filled 2018-02-04: qty 30

## 2018-02-04 MED ORDER — ONDANSETRON HCL 4 MG/2ML IJ SOLN
INTRAMUSCULAR | Status: DC | PRN
  Administered 2018-02-04: 4 mg via INTRAVENOUS

## 2018-02-04 MED ORDER — DEXAMETHASONE SODIUM PHOSPHATE 10 MG/ML IJ SOLN
INTRAMUSCULAR | Status: AC
  Filled 2018-02-04: qty 1

## 2018-02-04 MED ORDER — LACTATED RINGERS IV SOLN
INTRAVENOUS | Status: DC
  Administered 2018-02-04: 08:00:00 via INTRAVENOUS

## 2018-02-04 MED ORDER — 0.9 % SODIUM CHLORIDE (POUR BTL) OPTIME
TOPICAL | Status: DC | PRN
  Administered 2018-02-04: 1000 mL

## 2018-02-04 MED ORDER — BUPIVACAINE-EPINEPHRINE (PF) 0.25% -1:200000 IJ SOLN
INTRAMUSCULAR | Status: DC | PRN
  Administered 2018-02-04: 20 mL

## 2018-02-04 MED ORDER — FENTANYL CITRATE (PF) 250 MCG/5ML IJ SOLN
INTRAMUSCULAR | Status: DC | PRN
  Administered 2018-02-04: 100 ug via INTRAVENOUS
  Administered 2018-02-04 (×3): 50 ug via INTRAVENOUS

## 2018-02-04 MED ORDER — KETOROLAC TROMETHAMINE 30 MG/ML IJ SOLN
INTRAMUSCULAR | Status: DC | PRN
  Administered 2018-02-04: 30 mg via INTRAVENOUS

## 2018-02-04 MED ORDER — FENTANYL CITRATE (PF) 250 MCG/5ML IJ SOLN
INTRAMUSCULAR | Status: AC
  Filled 2018-02-04: qty 5

## 2018-02-04 MED ORDER — EPHEDRINE SULFATE-NACL 50-0.9 MG/10ML-% IV SOSY
PREFILLED_SYRINGE | INTRAVENOUS | Status: DC | PRN
  Administered 2018-02-04: 5 mg via INTRAVENOUS
  Administered 2018-02-04: 10 mg via INTRAVENOUS

## 2018-02-04 MED ORDER — SUCCINYLCHOLINE CHLORIDE 200 MG/10ML IV SOSY
PREFILLED_SYRINGE | INTRAVENOUS | Status: AC
  Filled 2018-02-04: qty 10

## 2018-02-04 MED ORDER — PROPOFOL 10 MG/ML IV BOLUS
INTRAVENOUS | Status: AC
  Filled 2018-02-04: qty 20

## 2018-02-04 MED ORDER — DEXAMETHASONE SODIUM PHOSPHATE 10 MG/ML IJ SOLN
INTRAMUSCULAR | Status: DC | PRN
  Administered 2018-02-04: 10 mg via INTRAVENOUS

## 2018-02-04 MED ORDER — GLYCOPYRROLATE PF 0.2 MG/ML IJ SOSY
PREFILLED_SYRINGE | INTRAMUSCULAR | Status: DC | PRN
  Administered 2018-02-04: .2 mg via INTRAVENOUS

## 2018-02-04 MED ORDER — FENTANYL CITRATE (PF) 100 MCG/2ML IJ SOLN: 25.0000 ug | INTRAMUSCULAR | Status: DC | PRN

## 2018-02-04 MED ORDER — GABAPENTIN 100 MG PO CAPS
100.0000 mg | ORAL_CAPSULE | ORAL | Status: AC
  Administered 2018-02-04: 100 mg via ORAL
  Filled 2018-02-04: qty 1

## 2018-02-04 MED ORDER — ACETAMINOPHEN 500 MG PO TABS
1000.0000 mg | ORAL_TABLET | ORAL | Status: AC
  Administered 2018-02-04: 1000 mg via ORAL
  Filled 2018-02-04: qty 2

## 2018-02-04 MED ORDER — PROPOFOL 10 MG/ML IV BOLUS
INTRAVENOUS | Status: DC | PRN
  Administered 2018-02-04: 200 mg via INTRAVENOUS

## 2018-02-04 MED ORDER — GLYCOPYRROLATE PF 0.2 MG/ML IJ SOSY
PREFILLED_SYRINGE | INTRAMUSCULAR | Status: AC
  Filled 2018-02-04: qty 2

## 2018-02-04 MED ORDER — OXYCODONE HCL 5 MG PO TABS: 5.0000 mg | ORAL_TABLET | Freq: Once | ORAL | Status: DC | PRN

## 2018-02-04 MED ORDER — MIDAZOLAM HCL 2 MG/2ML IJ SOLN
INTRAMUSCULAR | Status: AC
  Filled 2018-02-04: qty 2

## 2018-02-04 MED ORDER — OXYCODONE HCL 5 MG PO TABS: 5.0000 mg | ORAL_TABLET | ORAL | Status: DC | PRN

## 2018-02-04 MED ORDER — SUCCINYLCHOLINE CHLORIDE 200 MG/10ML IV SOSY
PREFILLED_SYRINGE | INTRAVENOUS | Status: DC | PRN
  Administered 2018-02-04: 100 mg via INTRAVENOUS

## 2018-02-04 MED ORDER — KETOROLAC TROMETHAMINE 30 MG/ML IJ SOLN
INTRAMUSCULAR | Status: AC
  Filled 2018-02-04: qty 1

## 2018-02-04 MED ORDER — ONDANSETRON HCL 4 MG/2ML IJ SOLN
INTRAMUSCULAR | Status: AC
  Filled 2018-02-04: qty 2

## 2018-02-04 MED ORDER — MIDAZOLAM HCL 5 MG/5ML IJ SOLN
INTRAMUSCULAR | Status: DC | PRN
  Administered 2018-02-04 (×2): 1 mg via INTRAVENOUS

## 2018-02-04 MED ORDER — LIDOCAINE 2% (20 MG/ML) 5 ML SYRINGE
INTRAMUSCULAR | Status: AC
  Filled 2018-02-04: qty 5

## 2018-02-04 MED ORDER — LACTATED RINGERS IV SOLN
INTRAVENOUS | Status: DC | PRN
  Administered 2018-02-04: 09:00:00 via INTRAVENOUS

## 2018-02-04 MED ORDER — ROCURONIUM BROMIDE 50 MG/5ML IV SOSY
PREFILLED_SYRINGE | INTRAVENOUS | Status: AC
  Filled 2018-02-04: qty 5

## 2018-02-04 MED ORDER — SUGAMMADEX SODIUM 200 MG/2ML IV SOLN
INTRAVENOUS | Status: DC | PRN
  Administered 2018-02-04: 176 mg via INTRAVENOUS

## 2018-02-04 MED ORDER — EPHEDRINE 5 MG/ML INJ
INTRAVENOUS | Status: AC
  Filled 2018-02-04: qty 20

## 2018-02-04 MED ORDER — ROCURONIUM BROMIDE 10 MG/ML (PF) SYRINGE
PREFILLED_SYRINGE | INTRAVENOUS | Status: DC | PRN
  Administered 2018-02-04: 40 mg via INTRAVENOUS

## 2018-02-04 MED ORDER — PHENYLEPHRINE 40 MCG/ML (10ML) SYRINGE FOR IV PUSH (FOR BLOOD PRESSURE SUPPORT)
PREFILLED_SYRINGE | INTRAVENOUS | Status: DC | PRN
  Administered 2018-02-04 (×2): 80 ug via INTRAVENOUS

## 2018-02-04 MED ORDER — OXYCODONE HCL 5 MG/5ML PO SOLN: 5.0000 mg | Freq: Once | ORAL | Status: DC | PRN

## 2018-02-04 MED ORDER — LIDOCAINE 2% (20 MG/ML) 5 ML SYRINGE
INTRAMUSCULAR | Status: DC | PRN
  Administered 2018-02-04: 100 mg via INTRAVENOUS

## 2018-02-04 MED ORDER — BUPIVACAINE HCL (PF) 0.25 % IJ SOLN
INTRAMUSCULAR | Status: AC
  Filled 2018-02-04: qty 30

## 2018-02-04 MED ORDER — ONDANSETRON HCL 4 MG/2ML IJ SOLN: 4.0000 mg | Freq: Once | INTRAMUSCULAR | Status: DC | PRN

## 2018-02-04 MED ORDER — SODIUM CHLORIDE 0.9 % IR SOLN
Status: DC | PRN
  Administered 2018-02-04: 1

## 2018-02-04 MED ORDER — MORPHINE SULFATE (PF) 2 MG/ML IV SOLN: 1.0000 mg | INTRAVENOUS | Status: DC | PRN

## 2018-02-04 SURGICAL SUPPLY — 42 items
ADH SKN CLS APL DERMABOND .7 (GAUZE/BANDAGES/DRESSINGS) ×1
APPLIER CLIP 5 13 M/L LIGAMAX5 (MISCELLANEOUS)
APPLIER CLIP ROT 10 11.4 M/L (STAPLE)
APR CLP MED LRG 11.4X10 (STAPLE)
APR CLP MED LRG 5 ANG JAW (MISCELLANEOUS)
BAG SPEC RTRVL LRG 6X4 10 (ENDOMECHANICALS) ×1
CANISTER SUCT 3000ML PPV (MISCELLANEOUS) ×3 IMPLANT
CHLORAPREP W/TINT 26ML (MISCELLANEOUS) ×3 IMPLANT
CLIP APPLIE 5 13 M/L LIGAMAX5 (MISCELLANEOUS) IMPLANT
CLIP APPLIE ROT 10 11.4 M/L (STAPLE) IMPLANT
COVER SURGICAL LIGHT HANDLE (MISCELLANEOUS) ×3 IMPLANT
CUTTER FLEX LINEAR 45M (STAPLE) IMPLANT
DERMABOND ADVANCED (GAUZE/BANDAGES/DRESSINGS) ×2
DERMABOND ADVANCED .7 DNX12 (GAUZE/BANDAGES/DRESSINGS) ×1 IMPLANT
ELECT REM PT RETURN 9FT ADLT (ELECTROSURGICAL) ×3
ELECTRODE REM PT RTRN 9FT ADLT (ELECTROSURGICAL) ×1 IMPLANT
GLOVE SURG SIGNA 7.5 PF LTX (GLOVE) ×3 IMPLANT
GOWN STRL REUS W/ TWL LRG LVL3 (GOWN DISPOSABLE) ×2 IMPLANT
GOWN STRL REUS W/ TWL XL LVL3 (GOWN DISPOSABLE) ×1 IMPLANT
GOWN STRL REUS W/TWL LRG LVL3 (GOWN DISPOSABLE) ×6
GOWN STRL REUS W/TWL XL LVL3 (GOWN DISPOSABLE) ×3
KIT BASIN OR (CUSTOM PROCEDURE TRAY) ×3 IMPLANT
KIT TURNOVER KIT B (KITS) ×3 IMPLANT
NS IRRIG 1000ML POUR BTL (IV SOLUTION) ×3 IMPLANT
PAD ARMBOARD 7.5X6 YLW CONV (MISCELLANEOUS) ×6 IMPLANT
POUCH SPECIMEN RETRIEVAL 10MM (ENDOMECHANICALS) ×3 IMPLANT
RELOAD 45 VASCULAR/THIN (ENDOMECHANICALS) IMPLANT
RELOAD STAPLE 45 2.5 WHT GRN (ENDOMECHANICALS) IMPLANT
RELOAD STAPLE 45 3.5 BLU ETS (ENDOMECHANICALS) IMPLANT
RELOAD STAPLE TA45 3.5 REG BLU (ENDOMECHANICALS) IMPLANT
SET IRRIG TUBING LAPAROSCOPIC (IRRIGATION / IRRIGATOR) ×3 IMPLANT
SHEARS HARMONIC ACE PLUS 36CM (ENDOMECHANICALS) ×3 IMPLANT
SLEEVE ENDOPATH XCEL 5M (ENDOMECHANICALS) ×3 IMPLANT
SPECIMEN JAR SMALL (MISCELLANEOUS) ×3 IMPLANT
SUT MON AB 4-0 PC3 18 (SUTURE) ×3 IMPLANT
TOWEL OR 17X24 6PK STRL BLUE (TOWEL DISPOSABLE) ×3 IMPLANT
TOWEL OR 17X26 10 PK STRL BLUE (TOWEL DISPOSABLE) ×3 IMPLANT
TRAY LAPAROSCOPIC MC (CUSTOM PROCEDURE TRAY) ×3 IMPLANT
TROCAR XCEL BLUNT TIP 100MML (ENDOMECHANICALS) ×3 IMPLANT
TROCAR XCEL NON-BLD 5MMX100MML (ENDOMECHANICALS) ×3 IMPLANT
TUBING INSUFFLATION (TUBING) ×3 IMPLANT
WATER STERILE IRR 1000ML POUR (IV SOLUTION) ×3 IMPLANT

## 2018-02-04 NOTE — Progress Notes (Signed)
Patient ID: Michael Casey, male   DOB: Feb 16, 1956, 62 y.o.   MRN: [id number]  Pre Procedure note for inpatients:   Michael Casey has been scheduled for Procedure(s): APPENDECTOMY LAPAROSCOPIC (N/A) today. The various methods of treatment have been discussed with the patient. After consideration of the risks, benefits and treatment options the patient has consented to the planned procedure.   The patient has been seen and labs reviewed. There are no changes in the patient's condition to prevent proceeding with the planned procedure today.  Recent labs:  Lab Results  Component Value Date   WBC 9.5 02/03/2018   HGB 15.0 02/03/2018   HCT 44.0 02/03/2018   PLT 224 02/03/2018   GLUCOSE 134 (H) 02/03/2018   CHOL 185 12/17/2006   TRIG 31 12/17/2006   HDL 81.6 12/17/2006   LDLCALC 97 12/17/2006   ALT 15 02/03/2018   AST 25 02/03/2018   NA 139 02/03/2018   K 3.7 02/03/2018   CL 105 02/03/2018   CREATININE 1.00 02/03/2018   BUN 21 02/03/2018   CO2 24 02/03/2018   TSH 1.05 12/17/2006   PSA 1.38 12/17/2006    , A, MD 02/04/2018 7:26 AM

## 2018-02-04 NOTE — Anesthesia Preprocedure Evaluation (Signed)
Anesthesia Evaluation  Patient identified by MRN, date of birth, ID band Patient awake    Reviewed: Allergy & Precautions, NPO status , Patient's Chart, lab work & pertinent test results  History of Anesthesia Complications Negative for: history of anesthetic complications  Airway Mallampati: II  TM Distance: >3 FB Neck ROM: Full    Dental no notable dental hx.    Pulmonary neg pulmonary ROS,    Pulmonary exam normal        Cardiovascular Exercise Tolerance: Good negative cardio ROS Normal cardiovascular exam     Neuro/Psych negative neurological ROS     GI/Hepatic negative GI ROS,   Endo/Other  negative endocrine ROS  Renal/GU negative Renal ROS  negative genitourinary   Musculoskeletal negative musculoskeletal ROS (+)   Abdominal   Peds  Hematology negative hematology ROS (+)   Anesthesia Other Findings   Reproductive/Obstetrics                             Anesthesia Physical Anesthesia Plan  ASA: I  Anesthesia Plan: General   Post-op Pain Management:    Induction: Intravenous  PONV Risk Score and Plan: 3 and Ondansetron, Dexamethasone and Treatment may vary due to age or medical condition  Airway Management Planned: Oral ETT  Additional Equipment:   Intra-op Plan:   Post-operative Plan: Extubation in OR  Informed Consent: I have reviewed the patients History and Physical, chart, labs and discussed the procedure including the risks, benefits and alternatives for the proposed anesthesia with the patient or authorized representative who has indicated his/her understanding and acceptance.   Dental advisory given  Plan Discussed with:   Anesthesia Plan Comments:         Anesthesia Quick Evaluation

## 2018-02-04 NOTE — Op Note (Signed)
Appendectomy, Lap, Procedure Note  Indications: The patient presented with a history of right-sided abdominal pain. A CT revealed findings consistent with acute appendicitis.  Pre-operative Diagnosis: acute appendicitis  Post-operative Diagnosis: Same  Surgeon: Coralie Keens A   Assistants: 0  Anesthesia: General endotracheal anesthesia  ASA Class: 2  Procedure Details  The patient was seen again in the Holding Room. The risks, benefits, complications, treatment options, and expected outcomes were discussed with the patient and/or family. The possibilities of reaction to medication, perforation of viscus, bleeding, recurrent infection, finding a normal appendix, the need for additional procedures, failure to diagnose a condition, and creating a complication requiring transfusion or operation were discussed. There was concurrence with the proposed plan and informed consent was obtained. The site of surgery was properly noted. The patient was taken to Operating Room, identified as WILLEM KLINGENSMITH and the procedure verified as Appendectomy. A Time Out was held and the above information confirmed.  The patient was placed in the supine position and general anesthesia was induced, along with placement of orogastric tube, Venodyne boots, and a Foley catheter. The abdomen was prepped and draped in a sterile fashion. A one centimeter infraumbilical incision was made.  The  midline fascia was incised with a #15 blade.  A Kelly clamp was used to confirm entrance into the peritoneal cavity.  A pursestring suture was passed around the incision with a 0 Vicryl.  The Hasson was introduced into the abdomen and the tails of the suture were used to hold the Hasson in place.   The pneumoperitoneum was then established to steady pressure of 15 mmHg.  Additional 5 mm cannulas then placed in the left lower quadrant of the abdomen and the right upper quadrant under direct visualization. A careful evaluation of  the entire abdomen was carried out. The patient was placed in Trendelenburg and left lateral decubitus position. The small intestines were retracted in the cephalad and left lateral direction away from the pelvis and right lower quadrant. The patient was found to have a very enlarged and inflamed appendix that was extending into the pelvis. There was no evidence of perforation.  The appendix was carefully dissected. The appendix was was skeletonized with the harmonic scalpel.   The appendix was divided at its base using an endo-GIA stapler. Minimal appendiceal stump was left in place. There was no evidence of bleeding, leakage, or complication after division of the appendix. Irrigation was also performed and irrigate suctioned from the abdomen as well.  The umbilical port site was closed with the purse string suture. There was no residual palpable fascial defect.  The trocar site skin wounds were closed with 4-0 Monocryl.  Instrument, sponge, and needle counts were correct at the conclusion of the case.   Findings: The appendix was found to be inflamed. There were not signs of necrosis.  There was not perforation. There was not abscess formation.  Estimated Blood Loss:  Minimal         Drains:none         Complications:  None; patient tolerated the procedure well.         Disposition: PACU - hemodynamically stable.         Condition: stable

## 2018-02-04 NOTE — Progress Notes (Signed)
Pt up in the hall walking with no issues.  Pt tolerating PO foods well, no issues reported. Pt given his discharge instruction and education provided.  Pt discharged home with wife.  Pt taken to front of hospital via w/c with staff.

## 2018-02-04 NOTE — Anesthesia Postprocedure Evaluation (Signed)
Anesthesia Post Note  Patient: TENNYSON WACHA  Procedure(s) Performed: APPENDECTOMY LAPAROSCOPIC (N/A Abdomen)     Patient location during evaluation: PACU Anesthesia Type: General Level of consciousness: awake and alert Pain management: pain level controlled Vital Signs Assessment: post-procedure vital signs reviewed and stable Respiratory status: spontaneous breathing, nonlabored ventilation, respiratory function stable and patient connected to nasal cannula oxygen Cardiovascular status: blood pressure returned to baseline and stable Postop Assessment: no apparent nausea or vomiting Anesthetic complications: no    Last Vitals:  Vitals:   02/04/18 1000 02/04/18 1015  BP: (!) 118/58 111/66  Pulse: 70 72  Resp: 14 17  Temp:  36.7 C  SpO2: 99% 96%    Last Pain:  Vitals:   02/04/18 1015  TempSrc:   PainSc: 0-No pain                 Lidia Collum

## 2018-02-04 NOTE — Discharge Instructions (Signed)

## 2018-02-04 NOTE — Progress Notes (Signed)
Pt arrived via stretcher with assistance of patient care tech. He was able to ambulate to the bathroom on arrival. Wife with him at bedside. All needs currently met at this time and pain in control. Oriented to room and [employer]

## 2018-02-04 NOTE — Discharge Summary (Signed)
[facility] Discharge Summary   Patient ID: DACODA FINLAY MRN: [id number] DOB/AGE: Oct 22, 1955 62 y.o.  Admit date: 02/03/2018 Discharge date: 02/04/2018  Admitting Diagnosis: appendicitis  Discharge Diagnosis Patient Active Problem List   Diagnosis Date Noted  . Appendicitis 02/03/2018  . 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 Electronically Signed   By: Lavonia Dana M.D.   On: 02/03/2018 21:24   Result Date: 02/03/2018 CLINICAL DATA:  Acute generalized abdominal pain, RIGHT lower quadrant pain since 0430 hours, nausea, vomiting, fever, chills EXAM: CT ABDOMEN AND PELVIS WITH CONTRAST TECHNIQUE: Multidetector CT imaging of the abdomen and pelvis was performed using the standard protocol following bolus administration of intravenous contrast. CONTRAST:  187mL ISOVUE-300 IOPAMIDOL (ISOVUE-300) INJECTION 61% IV. No oral contrast. COMPARISON:  None FINDINGS: Lower chest: Lung bases clear Hepatobiliary: Gallbladder and liver normal appearance Pancreas: Normal appearance Spleen: Normal appearance Adrenals/Urinary Tract: Tiny cysts at inferior pole RIGHT kidney. Adrenal glands, kidneys, ureters, and bladder otherwise normal appearance. Stomach/Bowel: Dilated appendix with periappendiceal inflammatory changes compatible with acute appendicitis: Appendix: Location: Inferior to cecal tip extending into RIGHT pelvis Diameter: 16 mm Appendicolith: Present, 6 mm Mucosal hyper-enhancement: Present, mild Extraluminal gas: None Periappendiceal collection: No defined periappendiceal collection. Small amount of dependent fluid at the caudal extent of the RIGHT pericolic gutter. Small amount of free fluid in pelvis. Vascular/Lymphatic:  Vascular structures patent.  No adenopathy. Reproductive: Minimal prostatic enlargement Other: No free air.  No hernia. Musculoskeletal: Unremarkable IMPRESSION: Acute appendicitis with small amount of free fluid in the pelvis and at the caudal extent of the RIGHT pericolic gutter. Appendicular LEFT present. No evidence of defined abscess or extraluminal gas. Findings called to Margarita Mail PA on 02/03/2018 at 2058 hours. Electronically Signed: By: Lavonia Dana M.D. On: 02/03/2018 20:58    Procedures Dr. Ninfa Linden (02/04/18) - Laparoscopic Appendectomy  Hospital Course:  Pt is a 62 yo male who [facility] to [facility] with abdominal pain.  Workup showed appendicitis.  Patient was admitted and underwent procedure listed above.  Tolerated procedure well and was transferred to the floor.  Diet was advanced as tolerated.  On POD#0, the patient was voiding well, tolerating diet, ambulating well, pain well controlled, vital signs stable, incisions c/d/i and felt stable for discharge home.  Patient will follow up as outlined below and knows to call with questions or concerns.     Patient was discharged in good condition.  The [place] Substance controlled database was reviewed prior to prescribing narcotic pain medication to this patient.  Physical Exam: General:  Alert, NAD, pleasant, cooperative Cardio: RRR, S1 & S2 normal, no murmur, rubs, gallops Resp: Effort normal, lungs CTA bilaterally, no wheezes, rales, rhonchi Abd:  Soft, ND, normal bowel sounds, very minimal generalized tenderness, incisions with glue intact and are without drainage Skin: warm and dry  Allergies as of 02/04/2018   No Known Allergies     Medication List    TAKE these medications   acetaminophen 325 MG tablet Commonly known as:  TYLENOL Take 650 mg by mouth every 6 (six) hours as needed (pain).   naproxen sodium 220 MG tablet Commonly known as:  ALEVE Take 220 mg by mouth daily as needed (pain).   oxyCODONE 5 MG  immediate release  tablet Commonly known as:  Oxy IR/ROXICODONE Take 1 tablet (5 mg total) by mouth every 6 (six) hours as needed for moderate pain.        Follow-up [facility] Follow up on 02/18/2018.   Specialty:  General Surgery Why:  @ 10:00am. Please arrive 30 minutes prior to complete paperwork. Please bring photo ID and insurance card Contact information: [address] [place] [phone]          Signed: [facility] 02/04/2018, 1:56 PM Pager: [phone] Consults: [phone] Mon-Fri 7:00 am-4:30 pm Sat-Sun 7:00 am-11:30 am

## 2018-02-04 NOTE — Transfer of Care (Signed)
Immediate Anesthesia Transfer of Care Note  Patient: Michael Casey  Procedure(s) Performed: APPENDECTOMY LAPAROSCOPIC (N/A Abdomen)  Patient Location: PACU  Anesthesia Type:General  Level of Consciousness: awake, alert  and patient cooperative  Airway & Oxygen Therapy: Patient Spontanous Breathing and Patient connected to nasal cannula oxygen  Post-op Assessment: Report given to RN and Post -op Vital signs reviewed and stable  Post vital signs: Reviewed and stable  Last Vitals:  Vitals Value Taken Time  BP 133/72 02/04/2018  9:46 AM  Temp    Pulse 73 02/04/2018  9:47 AM  Resp 17 02/04/2018  9:47 AM  SpO2 100 % 02/04/2018  9:47 AM  Vitals shown include unvalidated device data.  Last Pain:  Vitals:   02/04/18 0703  TempSrc:   PainSc: Asleep         Complications: No apparent anesthesia complications

## 2018-02-04 NOTE — Anesthesia Procedure Notes (Signed)
Procedure Name: Intubation Date/Time: 02/04/2018 8:57 AM Performed by: Renato Shin, CRNA Pre-anesthesia Checklist: Patient identified, Emergency Drugs available, Suction available and Patient being monitored Patient Re-evaluated:Patient Re-evaluated prior to induction Oxygen Delivery Method: Circle system utilized Preoxygenation: Pre-oxygenation with 100% oxygen Induction Type: IV induction Ventilation: Mask ventilation without difficulty Laryngoscope Size: Miller and 3 Grade View: Grade I Tube type: Oral Tube size: 7.5 mm Number of attempts: 1 Airway Equipment and Method: Stylet Placement Confirmation: ETT inserted through vocal cords under direct vision,  positive ETCO2,  CO2 detector and breath sounds checked- equal and bilateral Secured at: 23 cm Tube secured with: Tape Dental Injury: Teeth and Oropharynx as per pre-operative assessment

## 2018-02-05 ENCOUNTER — Encounter (HOSPITAL_COMMUNITY): Payer: Self-pay | Admitting: Surgery

## 2018-02-20 DIAGNOSIS — Z01818 Encounter for other preprocedural examination: Secondary | ICD-10-CM | POA: Diagnosis not present

## 2018-04-04 DIAGNOSIS — Z23 Encounter for immunization: Secondary | ICD-10-CM | POA: Diagnosis not present

## 2018-04-04 DIAGNOSIS — Z7189 Other specified counseling: Secondary | ICD-10-CM | POA: Diagnosis not present

## 2018-04-04 DIAGNOSIS — Z131 Encounter for screening for diabetes mellitus: Secondary | ICD-10-CM | POA: Diagnosis not present

## 2018-04-04 DIAGNOSIS — Z0000 Encounter for general adult medical examination without abnormal findings: Secondary | ICD-10-CM | POA: Diagnosis not present

## 2018-04-11 ENCOUNTER — Ambulatory Visit: Payer: 59 | Admitting: Internal Medicine

## 2018-04-11 DIAGNOSIS — Z0000 Encounter for general adult medical examination without abnormal findings: Secondary | ICD-10-CM

## 2018-04-11 DIAGNOSIS — Z789 Other specified health status: Secondary | ICD-10-CM

## 2018-04-11 DIAGNOSIS — Z23 Encounter for immunization: Secondary | ICD-10-CM | POA: Diagnosis not present

## 2018-04-11 DIAGNOSIS — Z9189 Other specified personal risk factors, not elsewhere classified: Secondary | ICD-10-CM

## 2018-04-11 DIAGNOSIS — Z7184 Encounter for health counseling related to travel: Secondary | ICD-10-CM

## 2018-04-11 MED ORDER — ATOVAQUONE-PROGUANIL HCL 250-100 MG PO TABS: 1.0000 | ORAL_TABLET | Freq: Every day | ORAL | 0 refills | Status: DC

## 2018-04-11 MED ORDER — AZITHROMYCIN 500 MG PO TABS: 500.0000 mg | ORAL_TABLET | Freq: Every day | ORAL | 0 refills | Status: DC

## 2018-04-11 MED ORDER — ATOVAQUONE-PROGUANIL HCL 250-100 MG PO TABS: 1.0000 | ORAL_TABLET | Freq: Every day | ORAL | 0 refills | Status: AC

## 2018-04-11 MED ORDER — AZITHROMYCIN 500 MG PO TABS: 500.0000 mg | ORAL_TABLET | Freq: Every day | ORAL | 0 refills | Status: AC

## 2018-04-11 NOTE — Progress Notes (Signed)
  Subjective:   Michael Casey is a 62 y.o. male who presents to the Infectious Disease clinic for travel consultation. Planned departure date: jan 11th-21st         Planned return date: [place] Areas in country: [place] then onto the [employer]: hotel, treehouse, [employer] education careers adviser Purpose of travel: leisure Prior travel out of [place]: yes    Objective:   Medications:reviewed    Assessment:   No contraindications to travel. none   Plan:   Pre-travel vaccine = will give tdap and typhoid vaccine today.  Malaria prophylaxis = gave rx for malarone to start on jan 9th through completion of trip  Traveler's diarrhea = gave precautions and azithromycin to use if needed

## 2018-04-16 DIAGNOSIS — D124 Benign neoplasm of descending colon: Secondary | ICD-10-CM | POA: Diagnosis not present

## 2018-04-16 DIAGNOSIS — D125 Benign neoplasm of sigmoid colon: Secondary | ICD-10-CM | POA: Diagnosis not present

## 2018-04-16 DIAGNOSIS — Z1211 Encounter for screening for malignant neoplasm of colon: Secondary | ICD-10-CM | POA: Diagnosis not present

## 2018-04-21 NOTE — Addendum Note (Signed)
Addended by: Landis Gandy on: 04/21/2018 12:54 PM   Modules accepted: Orders

## 2018-04-22 NOTE — Addendum Note (Signed)
Addended by: Landis Gandy on: 04/22/2018 05:42 PM   Modules accepted: Orders

## 2018-05-02 DIAGNOSIS — Z23 Encounter for immunization: Secondary | ICD-10-CM | POA: Diagnosis not present

## 2018-05-07 DIAGNOSIS — K210 Gastro-esophageal reflux disease with esophagitis: Secondary | ICD-10-CM | POA: Diagnosis not present

## 2018-05-07 DIAGNOSIS — E790 Hyperuricemia without signs of inflammatory arthritis and tophaceous disease: Secondary | ICD-10-CM | POA: Diagnosis not present

## 2018-05-07 DIAGNOSIS — Z0000 Encounter for general adult medical examination without abnormal findings: Secondary | ICD-10-CM | POA: Diagnosis not present

## 2018-08-12 DIAGNOSIS — D229 Melanocytic nevi, unspecified: Secondary | ICD-10-CM | POA: Diagnosis not present

## 2018-08-12 DIAGNOSIS — L720 Epidermal cyst: Secondary | ICD-10-CM | POA: Diagnosis not present

## 2018-10-08 DIAGNOSIS — Z23 Encounter for immunization: Secondary | ICD-10-CM | POA: Diagnosis not present

## 2019-08-12 DIAGNOSIS — Z03818 Encounter for observation for suspected exposure to other biological agents ruled out: Secondary | ICD-10-CM | POA: Diagnosis not present

## 2019-08-12 DIAGNOSIS — Z20828 Contact with and (suspected) exposure to other viral communicable diseases: Secondary | ICD-10-CM | POA: Diagnosis not present

## 2019-08-22 ENCOUNTER — Ambulatory Visit: Payer: 59

## 2019-09-23 DIAGNOSIS — Z125 Encounter for screening for malignant neoplasm of prostate: Secondary | ICD-10-CM | POA: Diagnosis not present

## 2019-09-23 DIAGNOSIS — Z135 Encounter for screening for eye and ear disorders: Secondary | ICD-10-CM | POA: Diagnosis not present

## 2019-09-23 DIAGNOSIS — Z0000 Encounter for general adult medical examination without abnormal findings: Secondary | ICD-10-CM | POA: Diagnosis not present

## 2019-09-23 DIAGNOSIS — Z13220 Encounter for screening for lipoid disorders: Secondary | ICD-10-CM | POA: Diagnosis not present

## 2019-10-02 DIAGNOSIS — L570 Actinic keratosis: Secondary | ICD-10-CM | POA: Diagnosis not present

## 2019-10-02 DIAGNOSIS — D225 Melanocytic nevi of trunk: Secondary | ICD-10-CM | POA: Diagnosis not present

## 2019-10-02 DIAGNOSIS — D485 Neoplasm of uncertain behavior of skin: Secondary | ICD-10-CM | POA: Diagnosis not present

## 2019-10-02 DIAGNOSIS — C44319 Basal cell carcinoma of skin of other parts of face: Secondary | ICD-10-CM | POA: Diagnosis not present

## 2019-10-02 DIAGNOSIS — L905 Scar conditions and fibrosis of skin: Secondary | ICD-10-CM | POA: Diagnosis not present

## 2019-10-02 DIAGNOSIS — L821 Other seborrheic keratosis: Secondary | ICD-10-CM | POA: Diagnosis not present

## 2019-10-05 DIAGNOSIS — Z0000 Encounter for general adult medical examination without abnormal findings: Secondary | ICD-10-CM | POA: Diagnosis not present

## 2019-10-05 DIAGNOSIS — E790 Hyperuricemia without signs of inflammatory arthritis and tophaceous disease: Secondary | ICD-10-CM | POA: Diagnosis not present

## 2019-10-05 DIAGNOSIS — K2100 Gastro-esophageal reflux disease with esophagitis, without bleeding: Secondary | ICD-10-CM | POA: Diagnosis not present

## 2019-11-04 DIAGNOSIS — C44319 Basal cell carcinoma of skin of other parts of face: Secondary | ICD-10-CM | POA: Diagnosis not present

## 2020-04-11 DIAGNOSIS — D225 Melanocytic nevi of trunk: Secondary | ICD-10-CM | POA: Diagnosis not present

## 2020-04-11 DIAGNOSIS — L989 Disorder of the skin and subcutaneous tissue, unspecified: Secondary | ICD-10-CM | POA: Diagnosis not present

## 2020-04-11 DIAGNOSIS — D485 Neoplasm of uncertain behavior of skin: Secondary | ICD-10-CM | POA: Diagnosis not present

## 2020-04-11 DIAGNOSIS — L814 Other melanin hyperpigmentation: Secondary | ICD-10-CM | POA: Diagnosis not present

## 2020-04-11 DIAGNOSIS — L578 Other skin changes due to chronic exposure to nonionizing radiation: Secondary | ICD-10-CM | POA: Diagnosis not present

## 2020-04-11 DIAGNOSIS — L905 Scar conditions and fibrosis of skin: Secondary | ICD-10-CM | POA: Diagnosis not present

## 2020-05-02 DIAGNOSIS — L989 Disorder of the skin and subcutaneous tissue, unspecified: Secondary | ICD-10-CM | POA: Diagnosis not present

## 2020-05-02 DIAGNOSIS — D0361 Melanoma in situ of right upper limb, including shoulder: Secondary | ICD-10-CM | POA: Diagnosis not present

## 2020-07-26 DIAGNOSIS — L905 Scar conditions and fibrosis of skin: Secondary | ICD-10-CM | POA: Diagnosis not present

## 2020-07-26 DIAGNOSIS — D485 Neoplasm of uncertain behavior of skin: Secondary | ICD-10-CM | POA: Diagnosis not present

## 2020-07-26 DIAGNOSIS — L570 Actinic keratosis: Secondary | ICD-10-CM | POA: Diagnosis not present

## 2020-07-26 DIAGNOSIS — D2271 Melanocytic nevi of right lower limb, including hip: Secondary | ICD-10-CM | POA: Diagnosis not present

## 2020-07-26 DIAGNOSIS — Z85828 Personal history of other malignant neoplasm of skin: Secondary | ICD-10-CM | POA: Diagnosis not present

## 2020-07-26 DIAGNOSIS — Z85820 Personal history of malignant melanoma of skin: Secondary | ICD-10-CM | POA: Diagnosis not present

## 2020-08-17 DIAGNOSIS — Z131 Encounter for screening for diabetes mellitus: Secondary | ICD-10-CM | POA: Diagnosis not present

## 2020-08-17 DIAGNOSIS — Z125 Encounter for screening for malignant neoplasm of prostate: Secondary | ICD-10-CM | POA: Diagnosis not present

## 2020-08-17 DIAGNOSIS — Z13220 Encounter for screening for lipoid disorders: Secondary | ICD-10-CM | POA: Diagnosis not present

## 2020-08-17 DIAGNOSIS — Z0000 Encounter for general adult medical examination without abnormal findings: Secondary | ICD-10-CM | POA: Diagnosis not present

## 2020-08-17 DIAGNOSIS — Z1329 Encounter for screening for other suspected endocrine disorder: Secondary | ICD-10-CM | POA: Diagnosis not present

## 2020-09-20 DIAGNOSIS — R972 Elevated prostate specific antigen [PSA]: Secondary | ICD-10-CM | POA: Diagnosis not present

## 2020-09-20 DIAGNOSIS — F5221 Male erectile disorder: Secondary | ICD-10-CM | POA: Diagnosis not present

## 2020-09-22 DIAGNOSIS — R972 Elevated prostate specific antigen [PSA]: Secondary | ICD-10-CM | POA: Diagnosis not present

## 2020-10-03 DIAGNOSIS — Z85828 Personal history of other malignant neoplasm of skin: Secondary | ICD-10-CM | POA: Diagnosis not present

## 2020-10-03 DIAGNOSIS — D485 Neoplasm of uncertain behavior of skin: Secondary | ICD-10-CM | POA: Diagnosis not present

## 2020-10-03 DIAGNOSIS — C4441 Basal cell carcinoma of skin of scalp and neck: Secondary | ICD-10-CM | POA: Diagnosis not present

## 2020-10-03 DIAGNOSIS — L821 Other seborrheic keratosis: Secondary | ICD-10-CM | POA: Diagnosis not present

## 2020-10-03 DIAGNOSIS — Z85820 Personal history of malignant melanoma of skin: Secondary | ICD-10-CM | POA: Diagnosis not present

## 2020-10-03 DIAGNOSIS — L905 Scar conditions and fibrosis of skin: Secondary | ICD-10-CM | POA: Diagnosis not present

## 2020-10-27 DIAGNOSIS — K2100 Gastro-esophageal reflux disease with esophagitis, without bleeding: Secondary | ICD-10-CM | POA: Diagnosis not present

## 2020-10-27 DIAGNOSIS — Z0000 Encounter for general adult medical examination without abnormal findings: Secondary | ICD-10-CM | POA: Diagnosis not present

## 2020-11-15 DIAGNOSIS — C4441 Basal cell carcinoma of skin of scalp and neck: Secondary | ICD-10-CM | POA: Diagnosis not present

## 2020-11-15 DIAGNOSIS — D485 Neoplasm of uncertain behavior of skin: Secondary | ICD-10-CM | POA: Diagnosis not present

## 2020-11-25 DIAGNOSIS — D225 Melanocytic nevi of trunk: Secondary | ICD-10-CM | POA: Diagnosis not present

## 2020-11-25 DIAGNOSIS — D485 Neoplasm of uncertain behavior of skin: Secondary | ICD-10-CM | POA: Diagnosis not present

## 2021-02-07 ENCOUNTER — Other Ambulatory Visit: Payer: Self-pay | Admitting: Internal Medicine

## 2021-02-07 DIAGNOSIS — R030 Elevated blood-pressure reading, without diagnosis of hypertension: Secondary | ICD-10-CM

## 2021-02-22 DIAGNOSIS — R229 Localized swelling, mass and lump, unspecified: Secondary | ICD-10-CM | POA: Diagnosis not present

## 2021-02-22 DIAGNOSIS — Z08 Encounter for follow-up examination after completed treatment for malignant neoplasm: Secondary | ICD-10-CM | POA: Diagnosis not present

## 2021-02-22 DIAGNOSIS — D229 Melanocytic nevi, unspecified: Secondary | ICD-10-CM | POA: Diagnosis not present

## 2021-02-22 DIAGNOSIS — Z85820 Personal history of malignant melanoma of skin: Secondary | ICD-10-CM | POA: Diagnosis not present

## 2021-04-03 ENCOUNTER — Ambulatory Visit
Admission: RE | Admit: 2021-04-03 | Discharge: 2021-04-03 | Disposition: A | Discharge status: Home / Self Care | Payer: No Typology Code available for payment source | Source: Ambulatory Visit | Attending: Internal Medicine | Admitting: Internal Medicine

## 2021-04-03 DIAGNOSIS — R030 Elevated blood-pressure reading, without diagnosis of hypertension: Secondary | ICD-10-CM

## 2021-05-04 ENCOUNTER — Telehealth: Payer: Self-pay | Admitting: *Deleted

## 2021-05-04 NOTE — Telephone Encounter (Signed)
error 

## 2021-05-15 DIAGNOSIS — L821 Other seborrheic keratosis: Secondary | ICD-10-CM | POA: Diagnosis not present

## 2021-05-15 DIAGNOSIS — L720 Epidermal cyst: Secondary | ICD-10-CM | POA: Diagnosis not present

## 2021-05-15 DIAGNOSIS — Z08 Encounter for follow-up examination after completed treatment for malignant neoplasm: Secondary | ICD-10-CM | POA: Diagnosis not present

## 2021-05-15 DIAGNOSIS — D229 Melanocytic nevi, unspecified: Secondary | ICD-10-CM | POA: Diagnosis not present

## 2021-05-31 DIAGNOSIS — Z08 Encounter for follow-up examination after completed treatment for malignant neoplasm: Secondary | ICD-10-CM | POA: Diagnosis not present

## 2021-05-31 DIAGNOSIS — D229 Melanocytic nevi, unspecified: Secondary | ICD-10-CM | POA: Diagnosis not present

## 2021-05-31 DIAGNOSIS — Z85820 Personal history of malignant melanoma of skin: Secondary | ICD-10-CM | POA: Diagnosis not present

## 2021-05-31 DIAGNOSIS — C4361 Malignant melanoma of right upper limb, including shoulder: Secondary | ICD-10-CM | POA: Diagnosis not present

## 2021-08-23 DIAGNOSIS — C44329 Squamous cell carcinoma of skin of other parts of face: Secondary | ICD-10-CM | POA: Diagnosis not present

## 2021-08-23 DIAGNOSIS — D485 Neoplasm of uncertain behavior of skin: Secondary | ICD-10-CM | POA: Diagnosis not present

## 2021-08-23 DIAGNOSIS — Z08 Encounter for follow-up examination after completed treatment for malignant neoplasm: Secondary | ICD-10-CM | POA: Diagnosis not present

## 2021-08-23 DIAGNOSIS — L570 Actinic keratosis: Secondary | ICD-10-CM | POA: Diagnosis not present

## 2021-08-23 DIAGNOSIS — L821 Other seborrheic keratosis: Secondary | ICD-10-CM | POA: Diagnosis not present

## 2021-08-23 DIAGNOSIS — Z85828 Personal history of other malignant neoplasm of skin: Secondary | ICD-10-CM | POA: Diagnosis not present

## 2021-08-23 DIAGNOSIS — C44611 Basal cell carcinoma of skin of unspecified upper limb, including shoulder: Secondary | ICD-10-CM | POA: Diagnosis not present

## 2021-09-12 DIAGNOSIS — L565 Disseminated superficial actinic porokeratosis (DSAP): Secondary | ICD-10-CM | POA: Diagnosis not present

## 2021-09-12 DIAGNOSIS — L570 Actinic keratosis: Secondary | ICD-10-CM | POA: Diagnosis not present

## 2021-09-12 DIAGNOSIS — H00014 Hordeolum externum left upper eyelid: Secondary | ICD-10-CM | POA: Diagnosis not present

## 2021-09-12 DIAGNOSIS — L578 Other skin changes due to chronic exposure to nonionizing radiation: Secondary | ICD-10-CM | POA: Diagnosis not present

## 2021-10-03 DIAGNOSIS — L905 Scar conditions and fibrosis of skin: Secondary | ICD-10-CM | POA: Diagnosis not present

## 2021-10-03 DIAGNOSIS — C44519 Basal cell carcinoma of skin of other part of trunk: Secondary | ICD-10-CM | POA: Diagnosis not present

## 2021-10-17 DIAGNOSIS — F5221 Male erectile disorder: Secondary | ICD-10-CM | POA: Diagnosis not present

## 2021-11-20 DIAGNOSIS — D485 Neoplasm of uncertain behavior of skin: Secondary | ICD-10-CM | POA: Diagnosis not present

## 2021-11-20 DIAGNOSIS — L439 Lichen planus, unspecified: Secondary | ICD-10-CM | POA: Diagnosis not present

## 2021-11-29 DIAGNOSIS — L570 Actinic keratosis: Secondary | ICD-10-CM | POA: Diagnosis not present

## 2021-11-29 DIAGNOSIS — D485 Neoplasm of uncertain behavior of skin: Secondary | ICD-10-CM | POA: Diagnosis not present

## 2021-11-29 DIAGNOSIS — C4361 Malignant melanoma of right upper limb, including shoulder: Secondary | ICD-10-CM | POA: Diagnosis not present

## 2021-11-29 DIAGNOSIS — D229 Melanocytic nevi, unspecified: Secondary | ICD-10-CM | POA: Diagnosis not present

## 2021-11-29 DIAGNOSIS — C44622 Squamous cell carcinoma of skin of right upper limb, including shoulder: Secondary | ICD-10-CM | POA: Diagnosis not present

## 2021-11-29 DIAGNOSIS — Z08 Encounter for follow-up examination after completed treatment for malignant neoplasm: Secondary | ICD-10-CM | POA: Diagnosis not present

## 2021-11-29 DIAGNOSIS — D2271 Melanocytic nevi of right lower limb, including hip: Secondary | ICD-10-CM | POA: Diagnosis not present

## 2021-11-29 DIAGNOSIS — Z85820 Personal history of malignant melanoma of skin: Secondary | ICD-10-CM | POA: Diagnosis not present

## 2021-12-27 DIAGNOSIS — L821 Other seborrheic keratosis: Secondary | ICD-10-CM | POA: Diagnosis not present

## 2021-12-27 DIAGNOSIS — L814 Other melanin hyperpigmentation: Secondary | ICD-10-CM | POA: Diagnosis not present

## 2021-12-27 DIAGNOSIS — Z08 Encounter for follow-up examination after completed treatment for malignant neoplasm: Secondary | ICD-10-CM | POA: Diagnosis not present

## 2021-12-27 DIAGNOSIS — D0461 Carcinoma in situ of skin of right upper limb, including shoulder: Secondary | ICD-10-CM | POA: Diagnosis not present

## 2021-12-27 DIAGNOSIS — L905 Scar conditions and fibrosis of skin: Secondary | ICD-10-CM | POA: Diagnosis not present

## 2021-12-27 DIAGNOSIS — L570 Actinic keratosis: Secondary | ICD-10-CM | POA: Diagnosis not present

## 2021-12-27 DIAGNOSIS — D1801 Hemangioma of skin and subcutaneous tissue: Secondary | ICD-10-CM | POA: Diagnosis not present

## 2022-03-06 DIAGNOSIS — H5201 Hypermetropia, right eye: Secondary | ICD-10-CM | POA: Diagnosis not present

## 2022-03-06 DIAGNOSIS — D3132 Benign neoplasm of left choroid: Secondary | ICD-10-CM | POA: Diagnosis not present

## 2022-03-06 DIAGNOSIS — H524 Presbyopia: Secondary | ICD-10-CM | POA: Diagnosis not present

## 2022-03-06 DIAGNOSIS — H2513 Age-related nuclear cataract, bilateral: Secondary | ICD-10-CM | POA: Diagnosis not present

## 2022-03-06 DIAGNOSIS — H52223 Regular astigmatism, bilateral: Secondary | ICD-10-CM | POA: Diagnosis not present

## 2022-04-03 DIAGNOSIS — L814 Other melanin hyperpigmentation: Secondary | ICD-10-CM | POA: Diagnosis not present

## 2022-04-03 DIAGNOSIS — Z08 Encounter for follow-up examination after completed treatment for malignant neoplasm: Secondary | ICD-10-CM | POA: Diagnosis not present

## 2022-04-03 DIAGNOSIS — L718 Other rosacea: Secondary | ICD-10-CM | POA: Diagnosis not present

## 2022-04-03 DIAGNOSIS — D485 Neoplasm of uncertain behavior of skin: Secondary | ICD-10-CM | POA: Diagnosis not present

## 2022-04-03 DIAGNOSIS — L821 Other seborrheic keratosis: Secondary | ICD-10-CM | POA: Diagnosis not present

## 2022-04-03 DIAGNOSIS — C44729 Squamous cell carcinoma of skin of left lower limb, including hip: Secondary | ICD-10-CM | POA: Diagnosis not present

## 2022-04-13 DIAGNOSIS — Z23 Encounter for immunization: Secondary | ICD-10-CM | POA: Diagnosis not present

## 2022-05-31 DIAGNOSIS — L570 Actinic keratosis: Secondary | ICD-10-CM | POA: Diagnosis not present

## 2022-05-31 DIAGNOSIS — Z85820 Personal history of malignant melanoma of skin: Secondary | ICD-10-CM | POA: Diagnosis not present

## 2022-05-31 DIAGNOSIS — L578 Other skin changes due to chronic exposure to nonionizing radiation: Secondary | ICD-10-CM | POA: Diagnosis not present

## 2022-05-31 DIAGNOSIS — D229 Melanocytic nevi, unspecified: Secondary | ICD-10-CM | POA: Diagnosis not present

## 2022-06-04 DIAGNOSIS — L720 Epidermal cyst: Secondary | ICD-10-CM | POA: Diagnosis not present

## 2022-06-04 DIAGNOSIS — D0472 Carcinoma in situ of skin of left lower limb, including hip: Secondary | ICD-10-CM | POA: Diagnosis not present

## 2022-07-06 DIAGNOSIS — Z0000 Encounter for general adult medical examination without abnormal findings: Secondary | ICD-10-CM | POA: Diagnosis not present

## 2022-07-30 DIAGNOSIS — Z23 Encounter for immunization: Secondary | ICD-10-CM | POA: Diagnosis not present

## 2022-10-16 DIAGNOSIS — F5221 Male erectile disorder: Secondary | ICD-10-CM | POA: Diagnosis not present

## 2022-10-16 DIAGNOSIS — R972 Elevated prostate specific antigen [PSA]: Secondary | ICD-10-CM | POA: Diagnosis not present

## 2022-10-17 ENCOUNTER — Other Ambulatory Visit: Payer: Self-pay | Admitting: Urology

## 2022-10-17 DIAGNOSIS — R972 Elevated prostate specific antigen [PSA]: Secondary | ICD-10-CM

## 2022-11-20 DIAGNOSIS — Z08 Encounter for follow-up examination after completed treatment for malignant neoplasm: Secondary | ICD-10-CM | POA: Diagnosis not present

## 2022-11-20 DIAGNOSIS — L821 Other seborrheic keratosis: Secondary | ICD-10-CM | POA: Diagnosis not present

## 2022-11-20 DIAGNOSIS — L570 Actinic keratosis: Secondary | ICD-10-CM | POA: Diagnosis not present

## 2022-11-20 DIAGNOSIS — L814 Other melanin hyperpigmentation: Secondary | ICD-10-CM | POA: Diagnosis not present

## 2022-11-20 DIAGNOSIS — Z85828 Personal history of other malignant neoplasm of skin: Secondary | ICD-10-CM | POA: Diagnosis not present

## 2022-11-30 ENCOUNTER — Ambulatory Visit
Admission: RE | Admit: 2022-11-30 | Discharge: 2022-11-30 | Disposition: A | Discharge status: Home / Self Care | Payer: BC Managed Care – PPO | Source: Ambulatory Visit | Attending: Urology

## 2022-11-30 DIAGNOSIS — R972 Elevated prostate specific antigen [PSA]: Secondary | ICD-10-CM

## 2022-11-30 DIAGNOSIS — N400 Enlarged prostate without lower urinary tract symptoms: Secondary | ICD-10-CM | POA: Diagnosis not present

## 2022-11-30 MED ORDER — GADOPICLENOL 0.5 MMOL/ML IV SOLN
9.0000 mL | Freq: Once | INTRAVENOUS | Status: AC | PRN
  Administered 2022-11-30: 9 mL via INTRAVENOUS

## 2022-12-11 DIAGNOSIS — L738 Other specified follicular disorders: Secondary | ICD-10-CM | POA: Diagnosis not present

## 2022-12-11 DIAGNOSIS — Z85820 Personal history of malignant melanoma of skin: Secondary | ICD-10-CM | POA: Diagnosis not present

## 2022-12-11 DIAGNOSIS — D2372 Other benign neoplasm of skin of left lower limb, including hip: Secondary | ICD-10-CM | POA: Diagnosis not present

## 2022-12-11 DIAGNOSIS — L821 Other seborrheic keratosis: Secondary | ICD-10-CM | POA: Diagnosis not present

## 2023-01-11 DIAGNOSIS — N4232 Atypical small acinar proliferation of prostate: Secondary | ICD-10-CM | POA: Diagnosis not present

## 2023-01-11 DIAGNOSIS — D075 Carcinoma in situ of prostate: Secondary | ICD-10-CM | POA: Diagnosis not present

## 2023-01-11 DIAGNOSIS — C61 Malignant neoplasm of prostate: Secondary | ICD-10-CM | POA: Diagnosis not present

## 2023-01-22 DIAGNOSIS — C61 Malignant neoplasm of prostate: Secondary | ICD-10-CM | POA: Diagnosis not present

## 2023-01-23 ENCOUNTER — Other Ambulatory Visit (HOSPITAL_COMMUNITY): Payer: Self-pay | Admitting: Urology

## 2023-01-23 DIAGNOSIS — C61 Malignant neoplasm of prostate: Secondary | ICD-10-CM

## 2023-02-04 DIAGNOSIS — C61 Malignant neoplasm of prostate: Secondary | ICD-10-CM | POA: Diagnosis not present

## 2023-02-05 ENCOUNTER — Other Ambulatory Visit (HOSPITAL_COMMUNITY): Payer: No Typology Code available for payment source

## 2023-02-05 ENCOUNTER — Encounter (HOSPITAL_COMMUNITY)
Admission: RE | Admit: 2023-02-05 | Discharge: 2023-02-05 | Disposition: A | Discharge status: Home / Self Care | Payer: BC Managed Care – PPO | Source: Ambulatory Visit | Attending: Urology | Admitting: Urology

## 2023-02-05 DIAGNOSIS — C61 Malignant neoplasm of prostate: Secondary | ICD-10-CM | POA: Diagnosis not present

## 2023-02-05 MED ORDER — FLOTUFOLASTAT F 18 GALLIUM 296-5846 MBQ/ML IV SOLN
8.0000 | Freq: Once | INTRAVENOUS | Status: AC
  Administered 2023-02-05: 7.95 via INTRAVENOUS
  Filled 2023-02-05: qty 8

## 2023-02-07 ENCOUNTER — Telehealth: Payer: Self-pay

## 2023-02-07 NOTE — Telephone Encounter (Signed)
Called and left a message that if he had any questions in reference to the upcoming appointment to please give me a call

## 2023-02-08 ENCOUNTER — Telehealth: Payer: Self-pay | Admitting: Radiation Oncology

## 2023-02-08 DIAGNOSIS — C61 Malignant neoplasm of prostate: Secondary | ICD-10-CM | POA: Diagnosis not present

## 2023-02-08 NOTE — Telephone Encounter (Addendum)
9/13 @ 12:01 pm Patient's wife left voicemail to cancel his appointment.  He is currently scheduled for [facility] on 9/20 @ 8:00 am.  Email froward to [facility] and copied Ashlyn Bruning/Javata Epps, so they are aware.

## 2023-02-08 NOTE — Progress Notes (Signed)
RN spoke with patient's wife and they will be proceed with surgery with [facility] appointment for 9/20 cancelled per patient request.

## 2023-02-11 DIAGNOSIS — R278 Other lack of coordination: Secondary | ICD-10-CM | POA: Diagnosis not present

## 2023-02-11 DIAGNOSIS — M6281 Muscle weakness (generalized): Secondary | ICD-10-CM | POA: Diagnosis not present

## 2023-02-11 DIAGNOSIS — C61 Malignant neoplasm of prostate: Secondary | ICD-10-CM | POA: Diagnosis not present

## 2023-02-11 DIAGNOSIS — R293 Abnormal posture: Secondary | ICD-10-CM | POA: Diagnosis not present

## 2023-02-13 DIAGNOSIS — K649 Unspecified hemorrhoids: Secondary | ICD-10-CM | POA: Diagnosis not present

## 2023-02-13 DIAGNOSIS — K635 Polyp of colon: Secondary | ICD-10-CM | POA: Diagnosis not present

## 2023-02-13 DIAGNOSIS — Z86010 Personal history of colonic polyps: Secondary | ICD-10-CM | POA: Diagnosis not present

## 2023-02-13 DIAGNOSIS — D123 Benign neoplasm of transverse colon: Secondary | ICD-10-CM | POA: Diagnosis not present

## 2023-02-15 ENCOUNTER — Ambulatory Visit: Payer: BC Managed Care – PPO | Admitting: Radiation Oncology

## 2023-02-15 ENCOUNTER — Ambulatory Visit: Payer: No Typology Code available for payment source

## 2023-02-26 DIAGNOSIS — C61 Malignant neoplasm of prostate: Secondary | ICD-10-CM | POA: Diagnosis not present

## 2023-02-26 DIAGNOSIS — R278 Other lack of coordination: Secondary | ICD-10-CM | POA: Diagnosis not present

## 2023-02-26 DIAGNOSIS — M6281 Muscle weakness (generalized): Secondary | ICD-10-CM | POA: Diagnosis not present

## 2023-02-26 DIAGNOSIS — R293 Abnormal posture: Secondary | ICD-10-CM | POA: Diagnosis not present

## 2023-03-13 DIAGNOSIS — Z23 Encounter for immunization: Secondary | ICD-10-CM | POA: Diagnosis not present

## 2023-03-19 DIAGNOSIS — C61 Malignant neoplasm of prostate: Secondary | ICD-10-CM | POA: Diagnosis not present

## 2023-03-25 DIAGNOSIS — C61 Malignant neoplasm of prostate: Secondary | ICD-10-CM | POA: Diagnosis not present

## 2023-04-01 DIAGNOSIS — C61 Malignant neoplasm of prostate: Secondary | ICD-10-CM | POA: Diagnosis not present

## 2023-04-09 DIAGNOSIS — Z85828 Personal history of other malignant neoplasm of skin: Secondary | ICD-10-CM | POA: Diagnosis not present

## 2023-04-09 DIAGNOSIS — D485 Neoplasm of uncertain behavior of skin: Secondary | ICD-10-CM | POA: Diagnosis not present

## 2023-04-09 DIAGNOSIS — L821 Other seborrheic keratosis: Secondary | ICD-10-CM | POA: Diagnosis not present

## 2023-04-09 DIAGNOSIS — H61032 Chondritis of left external ear: Secondary | ICD-10-CM | POA: Diagnosis not present

## 2023-04-09 DIAGNOSIS — L570 Actinic keratosis: Secondary | ICD-10-CM | POA: Diagnosis not present

## 2023-04-09 DIAGNOSIS — Z08 Encounter for follow-up examination after completed treatment for malignant neoplasm: Secondary | ICD-10-CM | POA: Diagnosis not present

## 2023-06-10 DIAGNOSIS — D0471 Carcinoma in situ of skin of right lower limb, including hip: Secondary | ICD-10-CM | POA: Diagnosis not present

## 2023-06-10 DIAGNOSIS — L821 Other seborrheic keratosis: Secondary | ICD-10-CM | POA: Diagnosis not present

## 2023-06-12 DIAGNOSIS — C61 Malignant neoplasm of prostate: Secondary | ICD-10-CM | POA: Diagnosis not present

## 2023-06-13 DIAGNOSIS — D229 Melanocytic nevi, unspecified: Secondary | ICD-10-CM | POA: Diagnosis not present

## 2023-06-13 DIAGNOSIS — L821 Other seborrheic keratosis: Secondary | ICD-10-CM | POA: Diagnosis not present

## 2023-06-13 DIAGNOSIS — L570 Actinic keratosis: Secondary | ICD-10-CM | POA: Diagnosis not present

## 2023-06-13 DIAGNOSIS — L578 Other skin changes due to chronic exposure to nonionizing radiation: Secondary | ICD-10-CM | POA: Diagnosis not present

## 2023-07-27 IMAGING — CT CT CARDIAC CORONARY ARTERY CALCIUM SCORE
3 series · 14 of 20 positions shown, 16 images · non-contrast
Comparison: No priors.

CLINICAL DATA: 65-year-old Caucasian male with family history of
coronary artery disease. Evaluate for coronary artery disease.

EXAM:
CT CARDIAC CORONARY ARTERY CALCIUM SCORE
TECHNIQUE: Non-contrast imaging through the heart was performed using
prospective ECG gating. Image post processing was performed on an
independent workstation, allowing for quantitative analysis of the
heart and coronary arteries. Note that this exam targets the heart
and the chest was not imaged in its entirety.

[Series 2: calcium scoring 2.00 qr36 bestdiast 71% hrt calciu · axial · 0.42mm/px · z∈[+1773,+1869]mm · 4 of 80 slices shown]
[im 16/80  vessel]
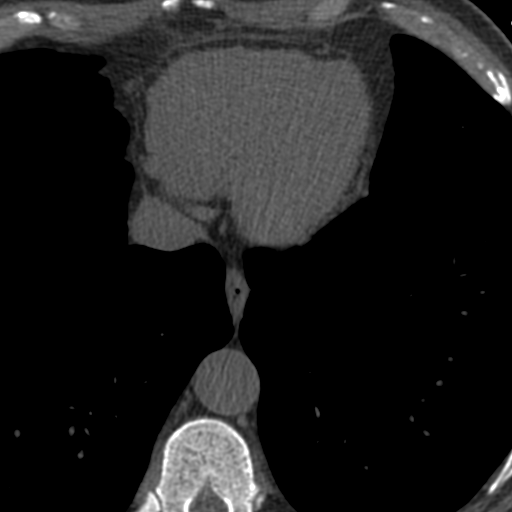
[im 32/80  vessel]
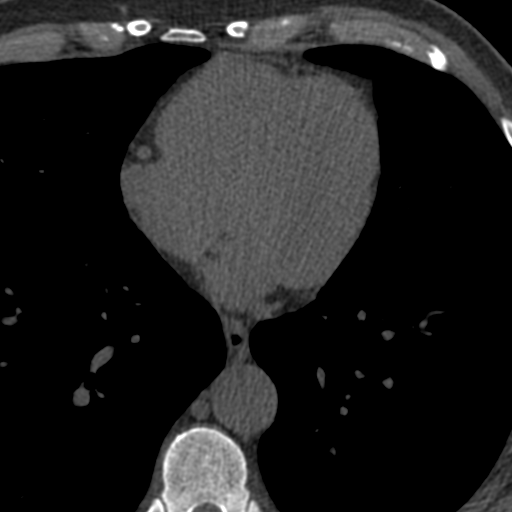
[im 48/80  vessel]
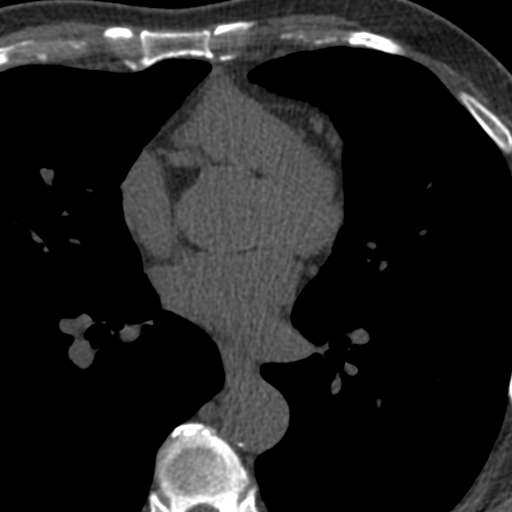
[im 64/80  vessel]
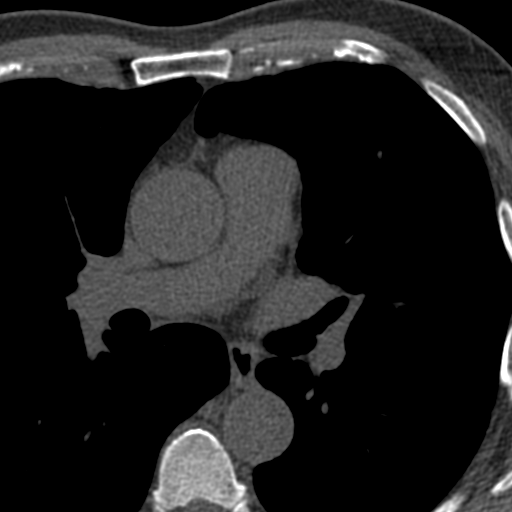

[Series 3: calcium scoring 2.00 br40 bestdiast 71% axial · axial · 0.60mm/px · z∈[+1769,+1873]mm · 5 of 80 slices shown, 7 images]
[im 14/80  vessel]
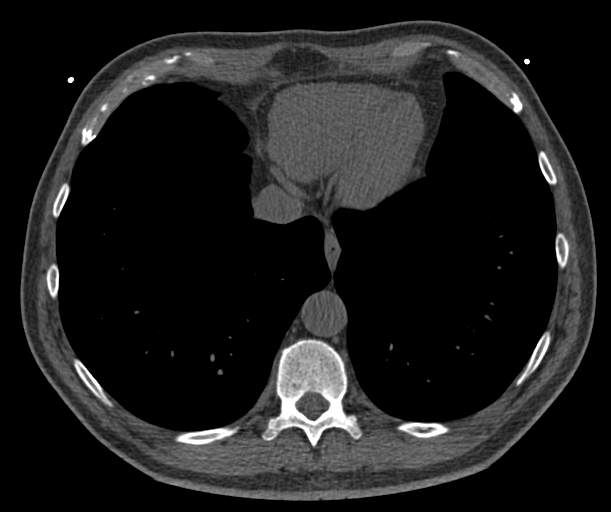
[im 14/80  lung]
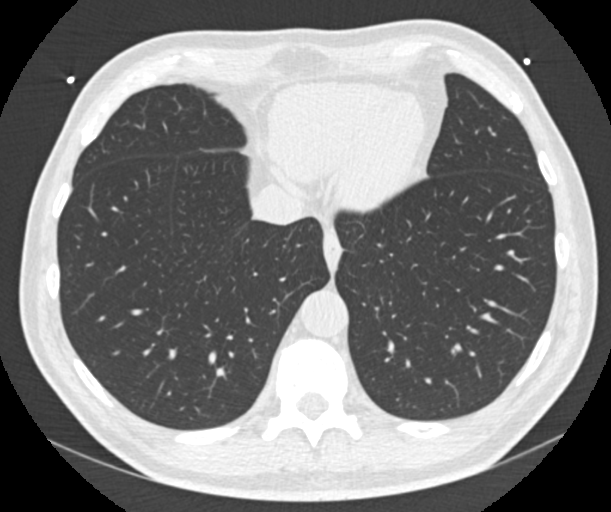
[im 27/80  vessel]
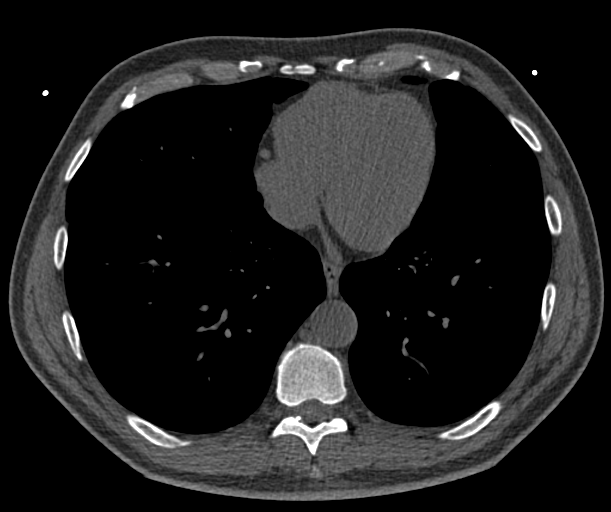
[im 40/80  vessel]
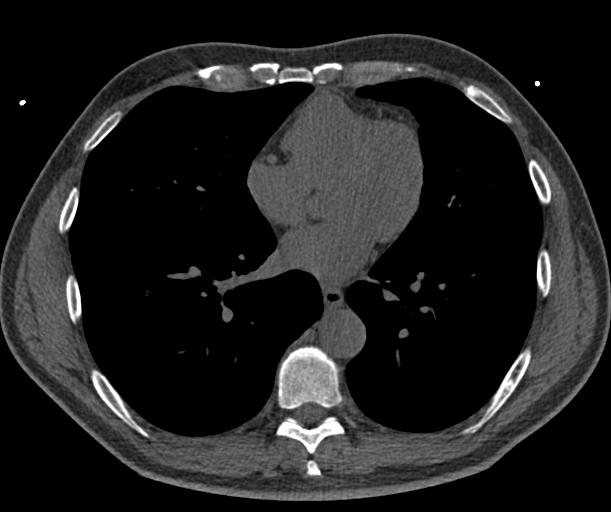
[im 53/80  vessel]
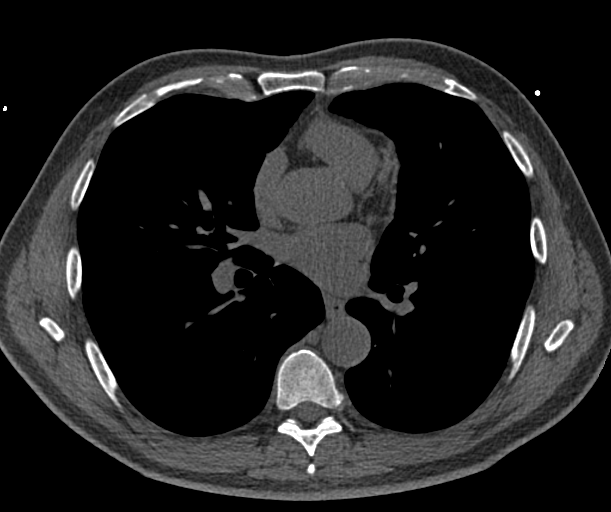
[im 66/80  vessel]
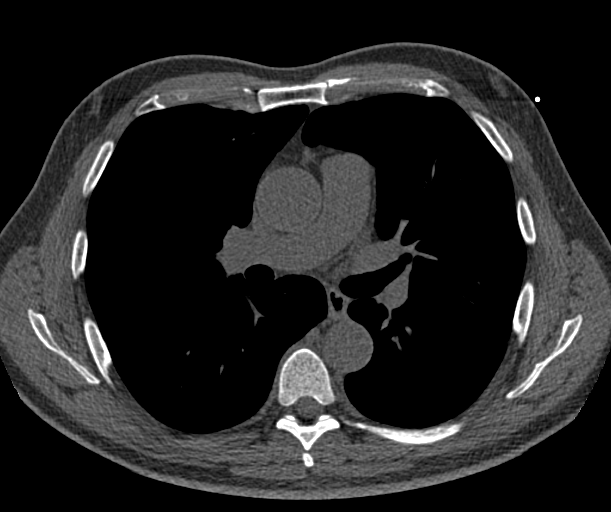
[im 66/80  lung]
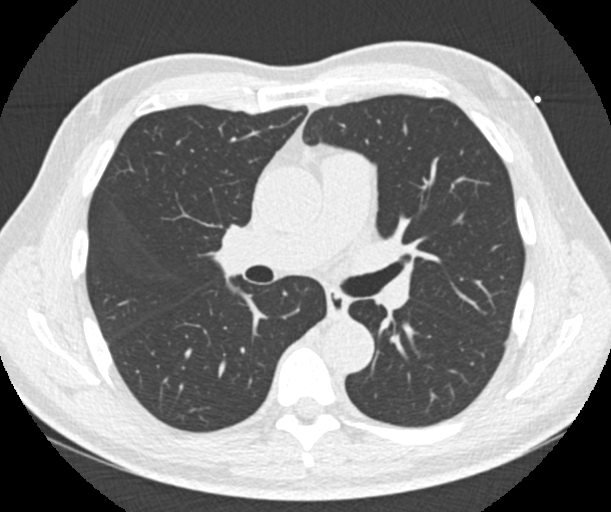

[Series 9: calcium scoring 2.00 br60 bestdiast 71% lungs · axial · 0.60mm/px · z∈[+1769,+1873]mm · 5 of 80 slices shown]
[im 14/80  vessel]
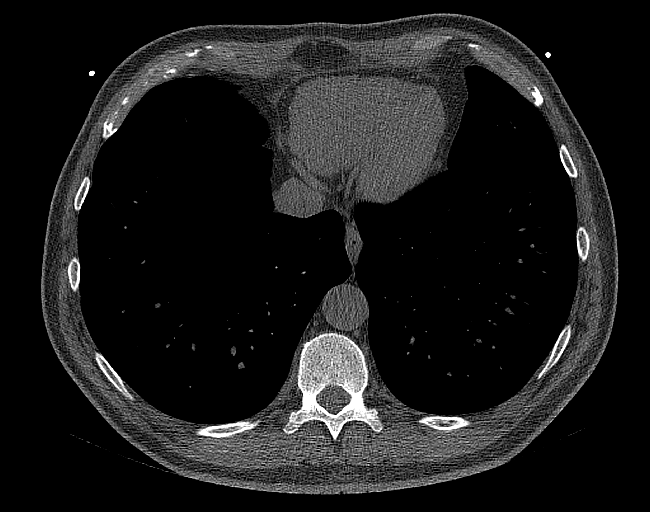
[im 27/80  vessel]
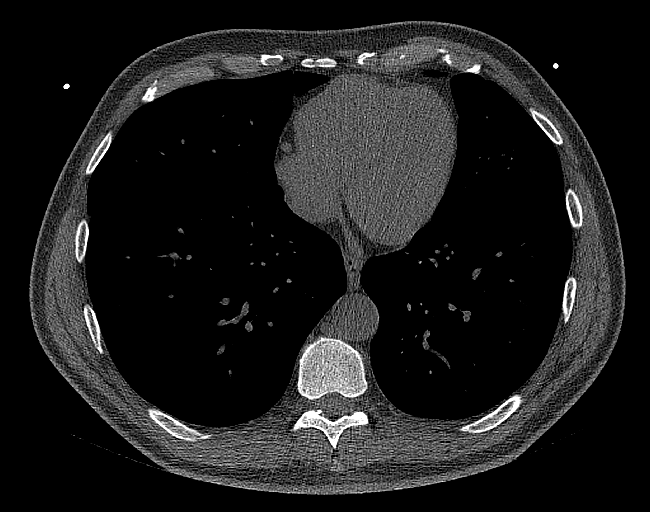
[im 40/80  vessel]
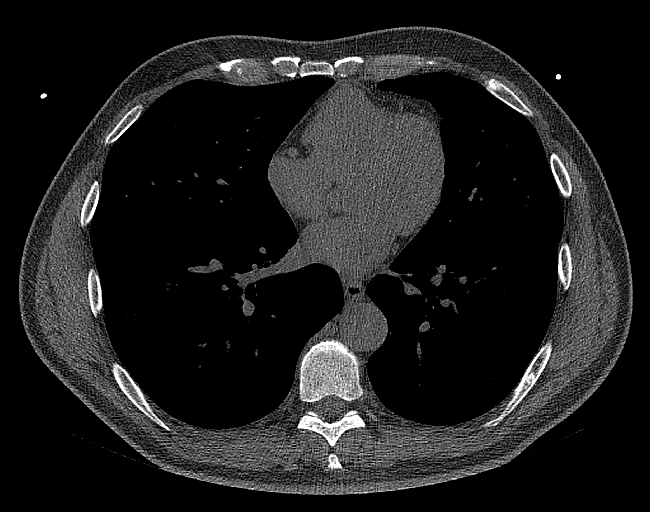
[im 53/80  vessel]
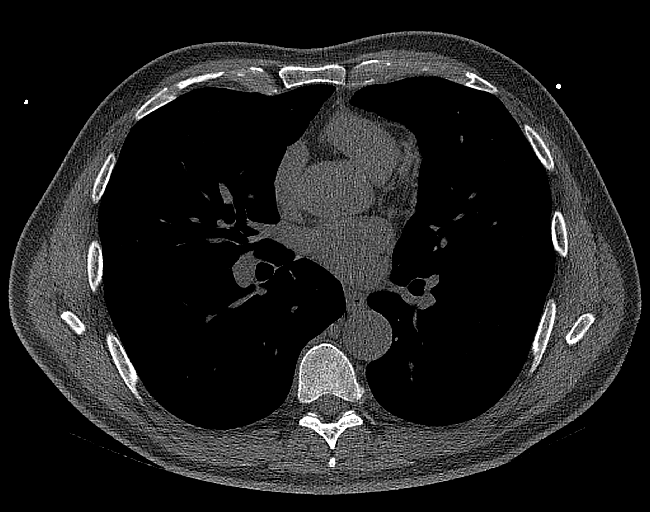
[im 66/80  vessel]
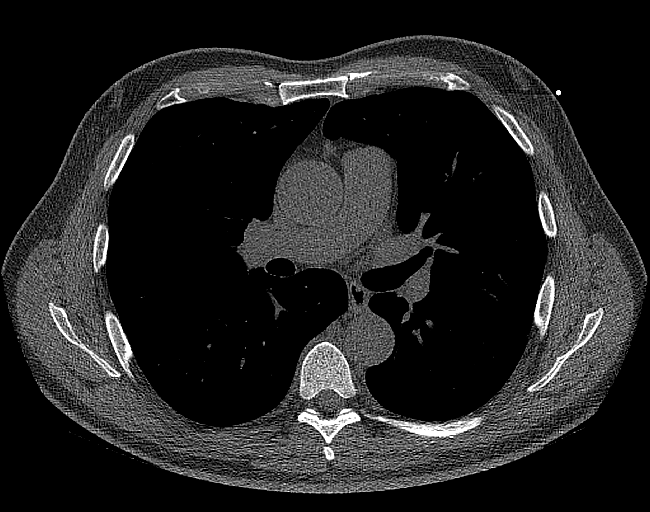

[14 of 20 positions shown; findings below may reference images not displayed]

FINDINGS: CORONARY CALCIUM SCORES:

Left Main: 11

LAD: 35

LCx: 0

RCA: 0

Total Agatston Score: 46

[HOSPITAL] percentile: 43rd

AORTA MEASUREMENTS:

Ascending Aorta: 43 mm

Descending Aorta: 30 mm

EXTRACARDIAC FINDINGS:

Within the visualized portions of the thorax there are no suspicious
appearing pulmonary nodules or masses, there is no acute
consolidative airspace disease, no pleural effusions, no
pneumothorax and no lymphadenopathy. Visualized portions of the
upper abdomen are unremarkable. There are no aggressive appearing
lytic or blastic lesions noted in the visualized portions of the
skeleton.
IMPRESSION: 1. Patient's total coronary artery calcium score is 46 which is 43rd
percentile for patient's of matched age, gender and race/ethnicity.
Please note that although the presence of coronary artery calcium
documents the presence of coronary artery disease, the severity of
this disease and any potential stenosis cannot be assessed on this
noncontrast CT examination. Assessment for potential risk factor
modification, dietary therapy or pharmacologic therapy may be
warranted, if clinically indicated.
2. No significant incidental noncardiac findings are noted.

## 2023-08-29 DIAGNOSIS — L814 Other melanin hyperpigmentation: Secondary | ICD-10-CM | POA: Diagnosis not present

## 2023-08-29 DIAGNOSIS — L570 Actinic keratosis: Secondary | ICD-10-CM | POA: Diagnosis not present

## 2023-08-29 DIAGNOSIS — L821 Other seborrheic keratosis: Secondary | ICD-10-CM | POA: Diagnosis not present

## 2023-08-29 DIAGNOSIS — L578 Other skin changes due to chronic exposure to nonionizing radiation: Secondary | ICD-10-CM | POA: Diagnosis not present

## 2023-08-29 DIAGNOSIS — D485 Neoplasm of uncertain behavior of skin: Secondary | ICD-10-CM | POA: Diagnosis not present

## 2023-10-28 DIAGNOSIS — L570 Actinic keratosis: Secondary | ICD-10-CM | POA: Diagnosis not present

## 2023-10-28 DIAGNOSIS — D2261 Melanocytic nevi of right upper limb, including shoulder: Secondary | ICD-10-CM | POA: Diagnosis not present

## 2023-10-28 DIAGNOSIS — L821 Other seborrheic keratosis: Secondary | ICD-10-CM | POA: Diagnosis not present

## 2023-10-28 DIAGNOSIS — D225 Melanocytic nevi of trunk: Secondary | ICD-10-CM | POA: Diagnosis not present

## 2023-10-28 DIAGNOSIS — D2262 Melanocytic nevi of left upper limb, including shoulder: Secondary | ICD-10-CM | POA: Diagnosis not present

## 2023-10-30 DIAGNOSIS — C61 Malignant neoplasm of prostate: Secondary | ICD-10-CM | POA: Diagnosis not present

## 2024-01-09 ENCOUNTER — Other Ambulatory Visit: Payer: Self-pay | Admitting: Internal Medicine

## 2024-01-09 DIAGNOSIS — E785 Hyperlipidemia, unspecified: Secondary | ICD-10-CM

## 2024-01-13 ENCOUNTER — Ambulatory Visit
Admission: RE | Admit: 2024-01-13 | Discharge: 2024-01-13 | Disposition: A | Discharge status: Home / Self Care | Source: Ambulatory Visit | Attending: Internal Medicine | Admitting: Internal Medicine

## 2024-01-13 DIAGNOSIS — E785 Hyperlipidemia, unspecified: Secondary | ICD-10-CM

## 2024-02-05 DIAGNOSIS — C61 Malignant neoplasm of prostate: Secondary | ICD-10-CM | POA: Diagnosis not present

## 2024-02-12 DIAGNOSIS — C61 Malignant neoplasm of prostate: Secondary | ICD-10-CM | POA: Diagnosis not present

## 2024-04-06 DIAGNOSIS — Z23 Encounter for immunization: Secondary | ICD-10-CM | POA: Diagnosis not present

## 2024-04-16 DIAGNOSIS — Z23 Encounter for immunization: Secondary | ICD-10-CM | POA: Diagnosis not present

## 2024-05-13 DIAGNOSIS — C61 Malignant neoplasm of prostate: Secondary | ICD-10-CM | POA: Diagnosis not present

## 2024-05-14 DIAGNOSIS — Z85828 Personal history of other malignant neoplasm of skin: Secondary | ICD-10-CM | POA: Diagnosis not present

## 2024-05-14 DIAGNOSIS — D1801 Hemangioma of skin and subcutaneous tissue: Secondary | ICD-10-CM | POA: Diagnosis not present

## 2024-05-14 DIAGNOSIS — L812 Freckles: Secondary | ICD-10-CM | POA: Diagnosis not present

## 2024-05-14 DIAGNOSIS — L821 Other seborrheic keratosis: Secondary | ICD-10-CM | POA: Diagnosis not present

## 2024-05-25 DIAGNOSIS — N529 Male erectile dysfunction, unspecified: Secondary | ICD-10-CM | POA: Diagnosis not present
